# Patient Record
Sex: Female | Born: 1960 | Race: White | Hispanic: No | Marital: Married | Smoking: Never smoker
Health system: Southern US, Community
[De-identification: ages and names within clinical notes are randomized; demographics above are authoritative.]

## PROBLEM LIST (undated history)

## (undated) DIAGNOSIS — E119 Type 2 diabetes mellitus without complications: Secondary | ICD-10-CM

## (undated) DIAGNOSIS — M792 Neuralgia and neuritis, unspecified: Secondary | ICD-10-CM

## (undated) DIAGNOSIS — K760 Fatty (change of) liver, not elsewhere classified: Secondary | ICD-10-CM

## (undated) DIAGNOSIS — Z8719 Personal history of other diseases of the digestive system: Secondary | ICD-10-CM

## (undated) DIAGNOSIS — Z87442 Personal history of urinary calculi: Secondary | ICD-10-CM

## (undated) DIAGNOSIS — G43909 Migraine, unspecified, not intractable, without status migrainosus: Secondary | ICD-10-CM

## (undated) DIAGNOSIS — E039 Hypothyroidism, unspecified: Secondary | ICD-10-CM

## (undated) DIAGNOSIS — E7800 Pure hypercholesterolemia, unspecified: Secondary | ICD-10-CM

## (undated) DIAGNOSIS — B029 Zoster without complications: Secondary | ICD-10-CM

## (undated) DIAGNOSIS — Z9889 Other specified postprocedural states: Secondary | ICD-10-CM

## (undated) DIAGNOSIS — M5020 Other cervical disc displacement, unspecified cervical region: Secondary | ICD-10-CM

## (undated) DIAGNOSIS — G4730 Sleep apnea, unspecified: Secondary | ICD-10-CM

## (undated) DIAGNOSIS — R112 Nausea with vomiting, unspecified: Secondary | ICD-10-CM

## (undated) DIAGNOSIS — M25512 Pain in left shoulder: Secondary | ICD-10-CM

## (undated) HISTORY — DX: Pure hypercholesterolemia, unspecified: E78.00

## (undated) HISTORY — DX: Migraine, unspecified, not intractable, without status migrainosus: G43.909

## (undated) HISTORY — PX: CERVICAL FUSION: SHX112

## (undated) HISTORY — DX: Hypothyroidism, unspecified: E03.9

## (undated) HISTORY — PX: REDUCTION MAMMAPLASTY: SUR839

## (undated) HISTORY — DX: Sleep apnea, unspecified: G47.30

## (undated) HISTORY — DX: Other cervical disc displacement, unspecified cervical region: M50.20

## (undated) HISTORY — DX: Type 2 diabetes mellitus without complications: E11.9

## (undated) HISTORY — DX: Neuralgia and neuritis, unspecified: M79.2

## (undated) HISTORY — PX: CARPAL TUNNEL RELEASE: SHX101

## (undated) HISTORY — PX: COLONOSCOPY: SHX174

---

## 1995-01-04 HISTORY — PX: BREAST REDUCTION SURGERY: SHX8

## 1998-01-03 HISTORY — PX: VAGINAL HYSTERECTOMY: SUR661

## 2005-07-21 DIAGNOSIS — E7800 Pure hypercholesterolemia, unspecified: Secondary | ICD-10-CM | POA: Insufficient documentation

## 2006-06-20 DIAGNOSIS — K2270 Barrett's esophagus without dysplasia: Secondary | ICD-10-CM | POA: Insufficient documentation

## 2007-03-01 DIAGNOSIS — K769 Liver disease, unspecified: Secondary | ICD-10-CM | POA: Insufficient documentation

## 2009-01-28 ENCOUNTER — Encounter: Admission: RE | Admit: 2009-01-28 | Discharge: 2009-04-28 | Payer: Self-pay | Admitting: Obstetrics & Gynecology

## 2009-04-09 DIAGNOSIS — E1165 Type 2 diabetes mellitus with hyperglycemia: Secondary | ICD-10-CM | POA: Insufficient documentation

## 2009-11-04 ENCOUNTER — Ambulatory Visit (HOSPITAL_COMMUNITY): Admission: RE | Admit: 2009-11-04 | Discharge: 2009-11-04 | Payer: Self-pay | Admitting: Obstetrics & Gynecology

## 2009-12-04 ENCOUNTER — Ambulatory Visit (HOSPITAL_COMMUNITY): Admission: RE | Admit: 2009-12-04 | Payer: Self-pay | Admitting: Obstetrics & Gynecology

## 2010-01-13 ENCOUNTER — Ambulatory Visit (HOSPITAL_COMMUNITY): Admission: RE | Admit: 2010-01-13 | Payer: Self-pay | Source: Home / Self Care | Admitting: Obstetrics & Gynecology

## 2010-02-03 ENCOUNTER — Encounter: Payer: Self-pay | Admitting: Obstetrics & Gynecology

## 2010-05-28 ENCOUNTER — Ambulatory Visit: Payer: Self-pay | Admitting: Gynecology

## 2010-08-20 ENCOUNTER — Ambulatory Visit: Payer: BC Managed Care – PPO | Attending: Gynecology | Admitting: Gynecology

## 2010-08-20 DIAGNOSIS — N83209 Unspecified ovarian cyst, unspecified side: Secondary | ICD-10-CM | POA: Insufficient documentation

## 2010-08-20 DIAGNOSIS — Z800 Family history of malignant neoplasm of digestive organs: Secondary | ICD-10-CM | POA: Insufficient documentation

## 2010-08-20 DIAGNOSIS — G4733 Obstructive sleep apnea (adult) (pediatric): Secondary | ICD-10-CM | POA: Insufficient documentation

## 2010-08-20 DIAGNOSIS — Z90710 Acquired absence of both cervix and uterus: Secondary | ICD-10-CM | POA: Insufficient documentation

## 2010-08-23 NOTE — Consult Note (Signed)
Jade Anderson, DERHAMMER NO.:  [id number]  MEDICAL RECORD NO.:  [id number]  LOCATION:  GYN                          FACILITY:  [facility]  PHYSICIAN:  De Blanch, M.D.DATE OF BIRTH:  [birth year]  DATE OF CONSULTATION:  08/20/2010 DATE OF DISCHARGE:                                CONSULTATION   REFERRING PHYSICIAN:  [name], MD  CHIEF COMPLAINT:  Ovarian cysts.  HISTORY OF PRESENT ILLNESS:  A 50 year old white married female seen in consultation at the request of Dr. Hyacinth Meeker regarding management of persistent ovarian cysts.  Apparently, the patient was undergoing evaluation, possible kidney stones in 2010 when a CT scan revealed that she had bilateral ovarian cysts.  These have been followed since 2010 with serial ultrasounds showing fluctuation in the size of the cysts. In the left adnexa, the cyst currently measures 3.3 x 3.1, with some other smaller cysts.  These were echo-free and have smooth margins.  The right adnexa measures 5.9 x 3.3 with multiple small cysts.  These were all simple and echo-free as well.  The patient herself has had no symptoms.  She denies any pelvic pain, pressure, or any urinary tract symptoms.  CA-125 in November 2011 was 6.8.  The patient has a past history of undergoing abdominal hysterectomy in 2007 for heavy bleeding and uterine fibroids.  At that time, apparently extensive adhesions were encountered.  OBSTETRICAL HISTORY:  Gravida 1.  PAST MEDICAL HISTORY:  The patient previously was morbidly obese, but has lost 50 pounds in recent months.  She was diabetic, but is now off all her diabetes medications.  PAST SURGICAL HISTORY:  Bilateral breast reduction 1997; bilateral carpal tunnel release; cervical spine fusion C5, C6 and C7; total abdominal hysterectomy 2007.  CURRENT MEDICATIONS:  Multivitamins and some dietary supplements.  She does not take any prescription medications.  It is noted the patient  has obstructive sleep apnea and uses C-PAP machine.  DRUG ALLERGIES: 1. MACROBID (rash). 2. REGLAN (anxiety). 3. HYDROCODONE (nausea).  The patient does note that she is able to     take oxycodone.  FAMILY HISTORY:  The patient's father had colon cancer.  There is no other gynecologic or breast cancer in the family history.  SOCIAL HISTORY:  The patient has been married for 31 years, does not smoke.  REVIEW OF SYSTEMS:  A 10-point comprehensive review of systems negative except as noted above.  PHYSICAL EXAMINATION:  VITAL SIGNS:  Weight 214 pounds, height 5 feet 3 inches, blood pressure 138/72. GENERAL:  The patient is a healthy, white female, in no acute distress. HEENT:  Negative. NECK:  Supple without thyromegaly.  There is no supraclavicular or inguinal adenopathy. ABDOMEN:  Soft, nontender.  No masses, organomegaly, or ascites are noted.  She has a healed Pfannenstiel incision. PELVIC:  EGBUS, vagina, bladder, urethra are normal.  Cervix and uterus surgically absent.  Adnexa without masses.  I am unable to palpate these cysts, possibly because of the patient's habitus. LOWER EXTREMITIES:  Without edema or varicosities.  IMPRESSION:  Complex, smooth-walled simple cyst of both adnexa which have been present since 2010 and have fluctuated somewhat in size according Dr. Rondel Baton notes.  The patient is entirely asymptomatic.  It is my impression that these are benign and given the fact that she is not symptomatic, would recommend they be followed rather than undergo surgical intervention.  The patient is in agreement with this plan.  She will return to the care of Dr. Hyacinth Meeker and I would suggest she have annual ultrasounds for continuing followup.     De Blanch, M.D.     DC/MEDQ  D:  08/20/2010  T:  08/21/2010  Job:  [id number]  cc:   Telford Nab, R.N. [address] [place], [place] [address]  [name], MD Fax: [phone]  Electronically  Signed by De Blanch M.D. on 08/23/2010 11:44:50 AM

## 2010-11-10 ENCOUNTER — Other Ambulatory Visit: Payer: Self-pay | Admitting: Obstetrics & Gynecology

## 2010-11-10 DIAGNOSIS — Z1231 Encounter for screening mammogram for malignant neoplasm of breast: Secondary | ICD-10-CM

## 2010-12-10 ENCOUNTER — Ambulatory Visit (HOSPITAL_COMMUNITY)
Admission: RE | Admit: 2010-12-10 | Discharge: 2010-12-10 | Disposition: A | Discharge status: Home / Self Care | Payer: BC Managed Care – PPO | Source: Ambulatory Visit | Attending: Obstetrics & Gynecology | Admitting: Obstetrics & Gynecology

## 2010-12-10 DIAGNOSIS — Z1231 Encounter for screening mammogram for malignant neoplasm of breast: Secondary | ICD-10-CM | POA: Insufficient documentation

## 2011-06-07 DIAGNOSIS — M5412 Radiculopathy, cervical region: Secondary | ICD-10-CM | POA: Insufficient documentation

## 2011-11-07 ENCOUNTER — Other Ambulatory Visit: Payer: Self-pay | Admitting: Obstetrics & Gynecology

## 2011-11-07 DIAGNOSIS — Z1231 Encounter for screening mammogram for malignant neoplasm of breast: Secondary | ICD-10-CM

## 2011-12-23 ENCOUNTER — Ambulatory Visit (HOSPITAL_COMMUNITY)
Admission: RE | Admit: 2011-12-23 | Discharge: 2011-12-23 | Disposition: A | Discharge status: Home / Self Care | Payer: BC Managed Care – PPO | Source: Ambulatory Visit | Attending: Obstetrics & Gynecology | Admitting: Obstetrics & Gynecology

## 2011-12-23 DIAGNOSIS — Z1231 Encounter for screening mammogram for malignant neoplasm of breast: Secondary | ICD-10-CM | POA: Insufficient documentation

## 2012-05-10 ENCOUNTER — Encounter: Payer: Self-pay | Admitting: Nurse Practitioner

## 2012-05-10 ENCOUNTER — Ambulatory Visit (INDEPENDENT_AMBULATORY_CARE_PROVIDER_SITE_OTHER): Payer: BC Managed Care – PPO | Admitting: Nurse Practitioner

## 2012-05-10 VITALS — BP 123/74 | HR 72 | Ht 64.5 in | Wt 227.0 lb

## 2012-05-10 DIAGNOSIS — H539 Unspecified visual disturbance: Secondary | ICD-10-CM

## 2012-05-10 DIAGNOSIS — H9319 Tinnitus, unspecified ear: Secondary | ICD-10-CM

## 2012-05-10 NOTE — Progress Notes (Signed)
HPI: Patient returns for followup after initial evaluation Dr. Terrace Arabia 03/06/2012. She continues to have mild  episodes of right tinnitus, seeing flashlight in the peripheral visual field. MRI of the brain done after her last visit was normal. She has a history of migraine headaches but these sensations are different.She is not have daily headache. 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 She's also had 2 cervical spine fusions and sees Dr. Manson Passey at [facility] most recently 4 weeks ago. He wants to do another fusion but she is holding off.  ROS:  - blurred vision, cramps, aching muscles, dizziness, anxiety  Physical Exam General: well developed, well nourished, seated, in no evident distress Head: head normocephalic and atraumatic. Oropharynx benign Neck: with restriction with ROM, no carotid  bruit Cardiovascular: regular rate and rhythm, no murmurs  Neurologic Exam Mental Status: Awake and fully alert. Oriented to place and time. Follows all commands  Mood and affect appropriate.  Cranial Nerves:  Pupils equal, briskly reactive to light. Extraocular movements full without nystagmus. Visual fields full to confrontation. Hearing intact and symmetric to finger snap. Facial sensation intact. Face, tongue, palate move normally and symmetrically. Neck flexion and extension normal.  Motor: Normal bulk and tone. Normal strength in all tested extremity muscles. No focal weakness Sensory.: intact to touch and pinprick and vibratory.  Coordination: Rapid alternating movements normal in all extremities. Finger-to-nose and heel-to-shin performed accurately bilaterally. Gait and Station: Arises from chair without difficulty. Stance is normal. Gait demonstrates normal stride length and balance . Able to heel, toe and tandem walk without difficulty.  Reflexes: 2+ and symmetric. Toes downgoing.     ASSESSMENT: Four-month history of intermittent visual  disturbance also right ear tinnitus. Normal MRI of the brain 03/08/2012. Could be migraine variants however patient does not want to  be placed on medication. Otherwise I'm not sure of the etiology of her visual complaints.     PLAN: Given copy of common migraine triggers Patient does not wish to be on medication at this time Given copy of normal MRI of the brain Followup in 6 months and prn   Nilda Riggs, GNP-BC APRN

## 2012-05-10 NOTE — Patient Instructions (Addendum)
Given copy of common migraine triggers Patient does not wish to be on medication at this time Given copy of normal MRI of the brain Followup in 6 months

## 2012-10-23 ENCOUNTER — Telehealth: Payer: Self-pay | Admitting: Obstetrics & Gynecology

## 2012-10-23 NOTE — Telephone Encounter (Signed)
Dr. Hyacinth Meeker,   This patient is requesting to have a PUS completed the same day as her aex. Is this okay? According to her chart she had a PUS on the same day as her aex last year. There is a sticky in her chart that says pus not available with aex. I just want to make sure I schedule according to your preference. Her chart is in my office if you need it.

## 2012-10-23 NOTE — Telephone Encounter (Signed)
Patient called and scheduled an AEX for 11/26/12 with Dr. Hyacinth Meeker. Patient states she usually has an ultrasound at the same time. Please help patient coordinate all this as she requests to have both done on the same day?

## 2012-10-24 NOTE — Telephone Encounter (Signed)
Yes.  Thank you.

## 2012-10-24 NOTE — Telephone Encounter (Signed)
I have her scheduled for a PUS on 12/4 @ 1pm followed by the OV with you at 1:30pm. Is this okay?

## 2012-10-24 NOTE — Telephone Encounter (Signed)
It is fine.  It is hard doing both same day and coordinating but if you can work it out, that is fine.  Thanks.

## 2012-11-05 ENCOUNTER — Other Ambulatory Visit: Payer: Self-pay | Admitting: Obstetrics & Gynecology

## 2012-11-05 DIAGNOSIS — N83209 Unspecified ovarian cyst, unspecified side: Secondary | ICD-10-CM

## 2012-11-12 ENCOUNTER — Ambulatory Visit (INDEPENDENT_AMBULATORY_CARE_PROVIDER_SITE_OTHER): Payer: BC Managed Care – PPO | Admitting: Nurse Practitioner

## 2012-11-12 ENCOUNTER — Encounter (INDEPENDENT_AMBULATORY_CARE_PROVIDER_SITE_OTHER): Payer: Self-pay

## 2012-11-12 ENCOUNTER — Encounter: Payer: Self-pay | Admitting: Nurse Practitioner

## 2012-11-12 VITALS — BP 123/75 | HR 72 | Ht 65.5 in | Wt 225.0 lb

## 2012-11-12 DIAGNOSIS — H9319 Tinnitus, unspecified ear: Secondary | ICD-10-CM

## 2012-11-12 DIAGNOSIS — H539 Unspecified visual disturbance: Secondary | ICD-10-CM

## 2012-11-12 NOTE — Progress Notes (Signed)
[facility]  PATIENT: Jade Anderson DOB: 05/09/1960   REASON FOR VISIT: follow up for tinnitus    HISTORY OF PRESENT ILLNESS:Jade Anderson , 52 year old returns for followup.  She was last seen 05/10/12. She is only having rare episodes of tinnitus and seeing flashing lights in the peripheral vision. These episodes are rare and she does not wish to be on medication. She is currently being treated for thrush. She has no new complaints.   HISTORY: She continues to have mild episodes of right tinnitus, seeing flashlight in the peripheral visual field. MRI of the brain done after her last visit was normal. She has a history of migraine headaches but these sensations are different.She is not have daily headache. 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 She's also had 2 cervical spine fusions and sees Dr. Manson Passey at [facility] most recently 4 weeks ago. He wants to do another fusion but she is holding off.    REVIEW OF SYSTEMS: Full 14 system review of systems performed and notable only for:  Constitutional: Weight gain Cardiovascular: N/A  Ear/Nose/Throat: N/A  Skin: Itching Eyes: N/A  Respiratory: N/A  Gastroitestinal: N/A  Hematology/Lymphatic: N/A  Endocrine: N/A Musculoskeletal:N/A  Allergy/Immunology: Allergies  Neurological: Occasional dizziness Psychiatric: N/A   ALLERGIES: Allergies  Allergen Reactions  . Eggs Or Egg-Derived Products     HOME MEDICATIONS: Outpatient Prescriptions Prior to Visit  Medication Sig Dispense Refill  . Cholecalciferol (VITAMIN D-3 PO) Take by mouth daily.      Chilton Si Tea, Camillia sinensis, (GREEN TEA PO) Take by mouth.      Marland Kitchen MAGNESIUM CITRATE PO Take by mouth daily.      Marland Kitchen PANCREATIN PO Take by mouth daily.       No facility-administered medications prior to visit.    PAST MEDICAL HISTORY: Past Medical History  Diagnosis Date  . Diabetes   . High cholesterol     . Sleep apnea     PAST SURGICAL HISTORY: Past Surgical History  Procedure Laterality Date  . Carpal tunnel release    . Vaginal hysterectomy    . Cervical fusion      C5-6,2012 and C6-7 2005  . Breast reduction surgery Bilateral     FAMILY HISTORY: Family History  Problem Relation Age of Onset  . Heart Problems Mother   . Gout Father   . High blood pressure Mother   . Diabetes Mother   . Diabetes Father   . Colon cancer Father     SOCIAL HISTORY: History   Social History  . Marital Status: Married    Spouse Name: Fredrik Cove    Number of Children: 1  . Years of Education: 12   Occupational History  . Not on file.   Social History Main Topics  . Smoking status: Never Smoker   . Smokeless tobacco: Never Used  . Alcohol Use: No  . Drug Use: No  . Sexual Activity: Not on file   Other Topics Concern  . Not on file   Social History Narrative   Patient lives at home with her husband Fredrik Cove. Patient has one child and a high school education.    Patient is right-handed.  Patient drinks one cup of tea daily and occasionally a diet soda.     PHYSICAL EXAM  Filed Vitals:   11/12/12 0811  BP: 123/75  Pulse: 72  Height: 5' 5.5" (1.664 m)  Weight:  225 lb (102.059 kg)   Body mass index is 36.86 kg/(m^2).  Generalized: Well developed, obese female in no acute distress, bilateral cerumen  Neurological examination   Mentation: Alert oriented to time, place, history taking. Follows all commands speech and language fluent  Cranial nerve II-XII: Pupils were equal round reactive to light extraocular movements were full, visual field were full on confrontational test. Facial sensation and strength were normal. hearing was intact to finger rubbing bilaterally. Uvula tongue midline. head turning and shoulder shrug and were normal and symmetric.Tongue protrusion into cheek strength was normal. Motor: normal bulk and tone, full strength in the BUE, BLE, fine finger movements  normal, no pronator drift. No focal weakness Coordination: finger-nose-finger, heel-to-shin bilaterally, no dysmetria Reflexes: Brachioradialis 2/2, biceps 2/2, triceps 2/2, patellar 2/2, Achilles 2/2, plantar responses were flexor bilaterally. Gait and Station: Rising up from seated position without assistance, normal stance, moderate stride, good arm swing, smooth turning, able to perform tiptoe, and heel walking without difficulty. Tandem gait is steady  DIAGNOSTIC DATA (LABS, IMAGING, TESTING) -None to review ASSESSMENT AND PLAN  52 y.o. year old female  has a past medical history of Diabetes; High cholesterol; and Sleep apnea and tinnitus  here to followup.  Her episodes of flashing lights and tinnitus are rare she does not want a preventive medication.  Call if they become more frequent F/U yearly and prn Nilda Riggs, [facility], [facility], APRN  [facility] [address], [address] [place], [place] [address] [phone]

## 2012-11-12 NOTE — Patient Instructions (Signed)
Episodes of flashing lights are rare Call if they become more frequent F/U yearly and prn

## 2012-11-26 ENCOUNTER — Ambulatory Visit: Payer: BC Managed Care – PPO | Admitting: Obstetrics & Gynecology

## 2012-12-06 ENCOUNTER — Ambulatory Visit (INDEPENDENT_AMBULATORY_CARE_PROVIDER_SITE_OTHER): Payer: BC Managed Care – PPO

## 2012-12-06 ENCOUNTER — Encounter: Payer: Self-pay | Admitting: Obstetrics & Gynecology

## 2012-12-06 ENCOUNTER — Telehealth: Payer: Self-pay | Admitting: Emergency Medicine

## 2012-12-06 ENCOUNTER — Ambulatory Visit (INDEPENDENT_AMBULATORY_CARE_PROVIDER_SITE_OTHER): Payer: BC Managed Care – PPO | Admitting: Obstetrics & Gynecology

## 2012-12-06 ENCOUNTER — Other Ambulatory Visit: Payer: Self-pay | Admitting: Obstetrics & Gynecology

## 2012-12-06 VITALS — BP 108/62 | HR 72 | Resp 16 | Ht 63.25 in | Wt 226.0 lb

## 2012-12-06 DIAGNOSIS — N838 Other noninflammatory disorders of ovary, fallopian tube and broad ligament: Secondary | ICD-10-CM

## 2012-12-06 DIAGNOSIS — N839 Noninflammatory disorder of ovary, fallopian tube and broad ligament, unspecified: Secondary | ICD-10-CM

## 2012-12-06 DIAGNOSIS — N83209 Unspecified ovarian cyst, unspecified side: Secondary | ICD-10-CM

## 2012-12-06 DIAGNOSIS — Z01419 Encounter for gynecological examination (general) (routine) without abnormal findings: Secondary | ICD-10-CM

## 2012-12-06 DIAGNOSIS — Z1231 Encounter for screening mammogram for malignant neoplasm of breast: Secondary | ICD-10-CM

## 2012-12-06 LAB — BASIC METABOLIC PANEL WITH GFR
Chloride: 102 mEq/L (ref 96–112)
GFR, Est African American: >89 mL/min
GFR, Est Non African American: 86 mL/min
Glucose, Bld: 116 mg/dL — ABNORMAL HIGH (ref 70–99)
Potassium: 4.1 mEq/L (ref 3.5–5.3)
Sodium: 139 mEq/L (ref 135–145)

## 2012-12-06 NOTE — Progress Notes (Signed)
Patient is scheduled for Pelvic MRI w and wo contrast for 12/13/12 at 2000 at [facility] [address].   Patient is scheduled for Screening MMG at [facility] for 12/29 at 12:00 at [facility] .

## 2012-12-06 NOTE — Progress Notes (Signed)
52 y.o. G1P1 MarriedCaucasianF here for annual exam.  Reports having major issues this year with left face and neck pain.  Passed a parotid stone and this helped some.  Did allergy testing.  This was essentially negative.  Had a submandibular gland stone that finally passed.  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  Pain is much better now.    Has also had recurrent conjunctivitis this year.  ENT thinks may be Sjogrens syndrome.  Being tested for thyroid, RA, and other autoimmune tests.  This was just done.  Also having some food sensitivity testing.    HbA1C was 5.2.  Off all medications.  Has kept down her weight.  Cholesterol was mildly elevated.  Has blood work every six months.  Not on cholesterol medication.    PUS also performed today.  Uterus absent.  Left ovary 3.6 x 2.8 x 4.0cm containing and irregular appearing, thick walled but avascular cyst.  Right ovary 4.1 x 3.4 x 2.8cm with multiple cysts.  29 x 24mm thick walled cyst seen.  Second 17mm cyst note.  Both avascular.  Both ovaries are both mildly enlarged from prior scan.  This may be due to new machine she was imaged on today but could also be enlarging size.  Patient's last menstrual period was 01/03/1998.          Sexually active: yes  The current method of family planning is status post hysterectomy.    Exercising: no  not regularly Smoker:  no  Health Maintenance: Pap:  08/20/07 History of abnormal Pap:  yes MMG:  12/23/11 normal Colonoscopy:  6/12 repeat in 5 years, Dr. Loreta Ave BMD:   12/10/10 normal, 0.7/-0.3 TDaP:  11/23/11 Screening Labs: PCP, Hb today: PCP, Urine today: today   reports that she has never smoked. She has never used smokeless tobacco. She reports that she does not drink alcohol or use illicit drugs.  Past Medical History  Diagnosis Date  . Diabetes     no medication  . High cholesterol     no medication  . Sleep apnea   .  Migraine     silent    Past Surgical History  Procedure Laterality Date  . Carpal tunnel release    . Vaginal hysterectomy    . Cervical fusion      C5-6,2012 and C6-7 2005  . Breast reduction surgery Bilateral     Current Outpatient Prescriptions  Medication Sig Dispense Refill  . Cholecalciferol (VITAMIN D-3 PO) Take by mouth daily.      Chilton Si Tea, Camillia sinensis, (GREEN TEA PO) Take by mouth.      Marland Kitchen MAGNESIUM CITRATE PO Take by mouth daily.      . NON FORMULARY HCL with pepsin daily      . PANCREATIN PO Take by mouth daily.      Marland Kitchen tobramycin-dexamethasone (TOBRADEX) ophthalmic solution        No current facility-administered medications for this visit.    Family History  Problem Relation Age of Onset  . Heart Problems Mother   . Gout Father   . High blood pressure Mother   . Diabetes Mother   . Diabetes Father   . Colon cancer Father     ROS:  Pertinent items are noted in HPI.  Otherwise, a comprehensive ROS was negative.  Exam:   BP 108/62  Pulse 72  Resp 16  Ht 5' 3.25" (  1.607 m)  Wt 226 lb (102.513 kg)  BMI 39.70 kg/m2  LMP 01/03/1998  Weight change: +14lbs  Height: 5' 3.25" (160.7 cm)  Ht Readings from Last 3 Encounters:  12/06/12 5' 3.25" (1.607 m)  11/12/12 5' 5.5" (1.664 m)  05/10/12 5' 4.5" (1.638 m)    General appearance: alert, cooperative and appears stated age Head: Normocephalic, without obvious abnormality, atraumatic Neck: no adenopathy, supple, symmetrical, trachea midline and thyroid normal to inspection and palpation Lungs: clear to auscultation bilaterally Breasts: normal appearance, no masses or tenderness Heart: regular rate and rhythm Abdomen: soft, non-tender; bowel sounds normal; no masses,  no organomegaly Extremities: extremities normal, atraumatic, no cyanosis or edema Skin: Skin color, texture, turgor normal. No rashes or lesions Lymph nodes: Cervical, supraclavicular, and axillary nodes normal. No abnormal inguinal  nodes palpated Neurologic: Grossly normal   Pelvic: External genitalia:  no lesions              Urethra:  normal appearing urethra with no masses, tenderness or lesions              Bartholins and Skenes: normal                 Vagina: normal appearing vagina with normal color and discharge, no lesions              Cervix: absent              Pap taken: no Bimanual Exam:  Uterus:  uterus absent              Adnexa: difficult to palpate ovaries but no abnormalities palpated               Rectovaginal: Confirms               Anus:  normal sphincter tone, no lesions  A:  Well Woman with normal exam PMP, no HRT Type 2 Diabetes H/O 2 neck surgeries H/O complex ovarian cysts Digestive issues which is improved with gastric acid medications (which are OTC products)   P:   Mammogram yearly pap smear not indicated Will plan ca-125 and Pelvic MRI as ovarian cysts are increased in size this year.   Labs every six months with PCP return annually or prn   In addition to AEX, about 15 minutes spent in face to face discussion about ultrasound findings and MRI.

## 2012-12-06 NOTE — Telephone Encounter (Signed)
Patient is scheduled for MRI of pelvis with and without contrast at [facility] on 12/11 at 8:00 PM. Can you precert.  This is for Ovarian Mass.

## 2012-12-12 ENCOUNTER — Encounter: Payer: Self-pay | Admitting: Obstetrics & Gynecology

## 2012-12-13 ENCOUNTER — Ambulatory Visit
Admission: RE | Admit: 2012-12-13 | Discharge: 2012-12-13 | Disposition: A | Discharge status: Home / Self Care | Payer: BC Managed Care – PPO | Source: Ambulatory Visit | Attending: Obstetrics & Gynecology | Admitting: Obstetrics & Gynecology

## 2012-12-13 DIAGNOSIS — N838 Other noninflammatory disorders of ovary, fallopian tube and broad ligament: Secondary | ICD-10-CM

## 2012-12-13 MED ORDER — GADOBENATE DIMEGLUMINE 529 MG/ML IV SOLN
20.0000 mL | Freq: Once | INTRAVENOUS | Status: AC | PRN
  Administered 2012-12-13: 20 mL via INTRAVENOUS

## 2012-12-14 ENCOUNTER — Telehealth: Payer: Self-pay | Admitting: Emergency Medicine

## 2012-12-14 DIAGNOSIS — N83209 Unspecified ovarian cyst, unspecified side: Secondary | ICD-10-CM

## 2012-12-14 NOTE — Telephone Encounter (Signed)
Message copied by Joeseph Amor on Fri Dec 14, 2012  9:03 AM ------      Message from: Jerene Bears      Created: Thu Dec 13, 2012  5:20 PM       Please call pt.  MRI showed simple cysts on the left ovary.  A hemorrhagic cyst noted on the right.  These are benign.  Follow up ultrasound 1 year recommended. ------

## 2012-12-14 NOTE — Telephone Encounter (Signed)
Message left to return call to Miangel Flom at [phone].    

## 2012-12-19 NOTE — Telephone Encounter (Signed)
Spoke with patient and message given. U/S and AEX scheduled together per patient request. U/S for next year scheduled.

## 2012-12-31 ENCOUNTER — Other Ambulatory Visit: Payer: Self-pay | Admitting: Obstetrics & Gynecology

## 2012-12-31 ENCOUNTER — Ambulatory Visit (HOSPITAL_COMMUNITY)
Admission: RE | Admit: 2012-12-31 | Discharge: 2012-12-31 | Disposition: A | Discharge status: Home / Self Care | Payer: BC Managed Care – PPO | Source: Ambulatory Visit | Attending: Obstetrics & Gynecology | Admitting: Obstetrics & Gynecology

## 2012-12-31 DIAGNOSIS — Z1231 Encounter for screening mammogram for malignant neoplasm of breast: Secondary | ICD-10-CM

## 2013-04-23 ENCOUNTER — Other Ambulatory Visit: Payer: Self-pay | Admitting: Gastroenterology

## 2013-04-23 DIAGNOSIS — R1011 Right upper quadrant pain: Secondary | ICD-10-CM

## 2013-05-03 ENCOUNTER — Ambulatory Visit (HOSPITAL_COMMUNITY): Payer: BC Managed Care – PPO

## 2013-05-08 ENCOUNTER — Ambulatory Visit (HOSPITAL_COMMUNITY)
Admission: RE | Admit: 2013-05-08 | Discharge: 2013-05-08 | Disposition: A | Discharge status: Home / Self Care | Payer: BC Managed Care – PPO | Source: Ambulatory Visit | Attending: Gastroenterology | Admitting: Gastroenterology

## 2013-05-08 DIAGNOSIS — N200 Calculus of kidney: Secondary | ICD-10-CM | POA: Insufficient documentation

## 2013-05-08 DIAGNOSIS — R1011 Right upper quadrant pain: Secondary | ICD-10-CM | POA: Insufficient documentation

## 2013-05-08 DIAGNOSIS — R110 Nausea: Secondary | ICD-10-CM | POA: Insufficient documentation

## 2013-05-08 MED ORDER — SINCALIDE 5 MCG IJ SOLR
INTRAMUSCULAR | Status: AC
  Administered 2013-05-08: 2.09 ug via INTRAVENOUS
  Filled 2013-05-08: qty 5

## 2013-05-08 MED ORDER — SINCALIDE 5 MCG IJ SOLR
0.0200 ug/kg | Freq: Once | INTRAMUSCULAR | Status: AC
  Administered 2013-05-08: 2.09 ug via INTRAVENOUS

## 2013-05-08 MED ORDER — SINCALIDE 5 MCG IJ SOLR
INTRAMUSCULAR | Status: AC
  Filled 2013-05-08: qty 5

## 2013-05-08 MED ORDER — TECHNETIUM TC 99M MEBROFENIN IV KIT
5.0000 | PACK | Freq: Once | INTRAVENOUS | Status: AC | PRN
  Administered 2013-05-08: 5 via INTRAVENOUS

## 2013-05-08 MED ORDER — STERILE WATER FOR INJECTION IJ SOLN
INTRAMUSCULAR | Status: AC
  Filled 2013-05-08: qty 10

## 2013-05-30 ENCOUNTER — Encounter (HOSPITAL_COMMUNITY): Payer: Self-pay | Admitting: Emergency Medicine

## 2013-05-30 ENCOUNTER — Emergency Department (HOSPITAL_COMMUNITY)
Admission: EM | Admit: 2013-05-30 | Discharge: 2013-05-30 | Disposition: A | Discharge status: Home / Self Care | Payer: BC Managed Care – PPO | Source: Home / Self Care | Attending: Emergency Medicine | Admitting: Emergency Medicine

## 2013-05-30 DIAGNOSIS — G5630 Lesion of radial nerve, unspecified upper limb: Secondary | ICD-10-CM

## 2013-05-30 DIAGNOSIS — M79609 Pain in unspecified limb: Secondary | ICD-10-CM

## 2013-05-30 DIAGNOSIS — M79603 Pain in arm, unspecified: Secondary | ICD-10-CM

## 2013-05-30 DIAGNOSIS — M5412 Radiculopathy, cervical region: Secondary | ICD-10-CM

## 2013-05-30 MED ORDER — GABAPENTIN 300 MG PO CAPS: 300.0000 mg | ORAL_CAPSULE | Freq: Three times a day (TID) | ORAL | Status: DC | PRN

## 2013-05-30 NOTE — Discharge Instructions (Signed)

## 2013-05-30 NOTE — ED Provider Notes (Signed)
CSN: [id number]     Arrival date & time 05/30/13  1224 History   First MD Initiated Contact with Patient 05/30/13 1234     Chief Complaint  Patient presents with  . Extremity Pain    arm    (Consider location/radiation/quality/duration/timing/severity/associated sxs/prior Treatment) HPI Comments: 53 year old female presents for evaluation of left arm pain. She had a HIDA scan done 3 weeks ago and she had a IV placed in her left antecubital fossa. After that, she developed swelling and a bruise that was painful. This was causing pain to radiate both up and down her arm. The bruise went away and the pain that radiated up her arm got better. However, she is still having pain in her forearm. She describes this as a severe burning pain that has been getting progressively worse. It is located in the dorsum of her entire forearm. The area is closer to her elbow is extremely tender but the rest of the arm feels strange. He also occasionally feels numb on the back of her hand. No swelling in the hand. No weakness in the hand  Patient is a 53 y.o. female presenting with extremity pain.  Extremity Pain    Past Medical History  Diagnosis Date  . Diabetes     no medication  . High cholesterol     no medication  . Sleep apnea   . Migraine     silent   Past Surgical History  Procedure Laterality Date  . Carpal tunnel release    . Vaginal hysterectomy    . Cervical fusion      C5-6,2012 and C6-7 2005  . Breast reduction surgery Bilateral    Family History  Problem Relation Age of Onset  . Heart Problems Mother   . Gout Father   . High blood pressure Mother   . Diabetes Mother   . Diabetes Father   . Colon cancer Father    History  Substance Use Topics  . Smoking status: Never Smoker   . Smokeless tobacco: Never Used  . Alcohol Use: No   OB History   Grav Para Term Preterm Abortions TAB SAB Ect Mult Living   1 1        1      Review of Systems  Musculoskeletal:       Arm pain,  see HPI  Neurological: Positive for weakness and numbness.  All other systems reviewed and are negative.   Allergies  Eggs or egg-derived products; Hydrocodone; and Macrobid  Home Medications   Prior to Admission medications   Medication Sig Start Date End Date Taking? Authorizing Provider  Cholecalciferol (VITAMIN D-3 PO) Take by mouth daily.    Historical Provider, MD  gabapentin (NEURONTIN) 300 MG capsule Take 1 capsule (300 mg total) by mouth 3 (three) times daily as needed. 05/30/13   Freeman Caldron Ashton Sabine, PA-C  Green Tea, Camillia sinensis, (GREEN TEA PO) Take by mouth.    Historical Provider, MD  MAGNESIUM CITRATE PO Take by mouth daily.    Historical Provider, MD  NON FORMULARY HCL with pepsin daily    Historical Provider, MD  PANCREATIN PO Take by mouth daily.    Historical Provider, MD  tobramycin-dexamethasone [facility]) ophthalmic solution  12/03/12   Historical Provider, MD   BP 118/71  Pulse 80  Temp(Src) 98.7 F (37.1 C) (Oral)  Resp 12  SpO2 100%  LMP 01/03/1998 Physical Exam  Nursing note and vitals reviewed. Constitutional: She is oriented to person, place, and time.  Vital signs are normal. She appears well-developed and well-nourished. No distress.  HENT:  Head: Normocephalic and atraumatic.  Cardiovascular:  Pulses:      Radial pulses are 2+ on the right side, and 2+ on the left side.  Pulmonary/Chest: Effort normal. No respiratory distress.  Musculoskeletal:       Left elbow: Tenderness found. Radial head (as well as TTP over entire dorsal forearm ) tenderness noted.  Neurological: She is alert and oriented to person, place, and time. She has normal strength and normal reflexes. No sensory deficit. She exhibits normal muscle tone. Coordination normal.  Her grip she is equal bilaterally. Her finger abduction and adduction, and thumb opposition is 5 out of 5 bilaterally  Skin: Skin is warm and dry. No rash noted. She is not diaphoretic.  Psychiatric: She has a  normal mood and affect. Judgment normal.    ED Course  Procedures (including critical care time) Labs Review Labs Reviewed - No data to display  Imaging Review No results found.   MDM   1. Arm pain   2. Radial neuritis    She must have what is hopefully temporary irritation of the radial nerve. I highly doubt permanent direct damage considering that she did not have any pain immediately. Will prescribe gabapentin to take as needed. This should resolve over the next couple weeks. She is instructed to followup with neurology or with her primary care provider if this does not resolve.  Meds ordered this encounter  Medications  . gabapentin (NEURONTIN) 300 MG capsule    Sig: Take 1 capsule (300 mg total) by mouth 3 (three) times daily as needed.    Dispense:  30 capsule    Refill:  0    Order Specific Question:  Supervising Provider    Answer:  Ihor Gully D Morenci, PA-C 05/31/13 2217

## 2013-05-30 NOTE — ED Notes (Signed)
Pt  States  She  Had  An iv  In  Her l  Arm   About  3  Weeks  Ago    That  Was  Placed  For a  Contrast procedure       She  Reports  Pain in the  Affected  Arm      descibes  It  As  Pain   Like needles  And  Pins       No  Swelling  Cap  Refill  Seems intact         No  Obvious bruising         Seen  By  Dr Collene Mares  Today in the  Office

## 2013-05-31 ENCOUNTER — Telehealth: Payer: Self-pay | Admitting: Neurology

## 2013-05-31 NOTE — Telephone Encounter (Signed)
Patient needs letter faxed regarding being dismissed from jury duty because patient has silent migraines with visual problems--letter does not need to state patients problems--Fax #[phone]--ATTN: H.F. Haymore--please call patient and advise--thank you.

## 2013-05-31 NOTE — Telephone Encounter (Signed)
Patient is scheduled for Jury duty on June 19, 2013 for orientation and would like a letter stating she is not able to do this because of her medical problems, silent migraines and visual problems, which you do not have to mention in the letter.

## 2013-06-01 NOTE — ED Provider Notes (Signed)
Medical screening examination/treatment/procedure(s) were performed by non-physician practitioner and as supervising physician I was immediately available for consultation/collaboration.  Philipp Deputy, M.D.  Harden Mo, MD 06/01/13 [phone]

## 2013-06-03 NOTE — Telephone Encounter (Signed)
Jade Anderson, please generate a letter for her jury duty excuse.

## 2013-06-05 NOTE — Telephone Encounter (Signed)
Patient calling to check on the status of her jury duty excuse letter, states that her phone has been out since yesterday and she is just making sure she didn't miss a call. Please return call and advise.

## 2013-06-06 ENCOUNTER — Encounter: Payer: Self-pay | Admitting: Neurology

## 2013-06-06 NOTE — Telephone Encounter (Signed)
Letter has been faxed to clerk of court and confirmation received.   Patient has been notified that letter was faxed.

## 2013-07-03 DIAGNOSIS — M5020 Other cervical disc displacement, unspecified cervical region: Secondary | ICD-10-CM

## 2013-07-03 HISTORY — DX: Other cervical disc displacement, unspecified cervical region: M50.20

## 2013-07-29 DIAGNOSIS — M7710 Lateral epicondylitis, unspecified elbow: Secondary | ICD-10-CM | POA: Insufficient documentation

## 2013-09-26 DIAGNOSIS — M7580 Other shoulder lesions, unspecified shoulder: Secondary | ICD-10-CM

## 2013-09-26 DIAGNOSIS — M778 Other enthesopathies, not elsewhere classified: Secondary | ICD-10-CM | POA: Insufficient documentation

## 2013-11-04 ENCOUNTER — Encounter (HOSPITAL_COMMUNITY): Payer: Self-pay | Admitting: Emergency Medicine

## 2013-11-12 ENCOUNTER — Ambulatory Visit (INDEPENDENT_AMBULATORY_CARE_PROVIDER_SITE_OTHER): Payer: BC Managed Care – PPO | Admitting: Nurse Practitioner

## 2013-11-12 ENCOUNTER — Encounter: Payer: Self-pay | Admitting: Nurse Practitioner

## 2013-11-12 VITALS — BP 116/81 | HR 78 | Ht 64.0 in | Wt 240.8 lb

## 2013-11-12 DIAGNOSIS — H539 Unspecified visual disturbance: Secondary | ICD-10-CM

## 2013-11-12 DIAGNOSIS — E785 Hyperlipidemia, unspecified: Secondary | ICD-10-CM | POA: Insufficient documentation

## 2013-11-12 DIAGNOSIS — H9313 Tinnitus, bilateral: Secondary | ICD-10-CM

## 2013-11-12 DIAGNOSIS — K219 Gastro-esophageal reflux disease without esophagitis: Secondary | ICD-10-CM | POA: Insufficient documentation

## 2013-11-12 NOTE — Patient Instructions (Signed)
Episodes of flashing light continue to be intermittent to rare Keep a record if they become more frequent Follow-up yearly and when necessary

## 2013-11-12 NOTE — Progress Notes (Signed)
[facility]  PATIENT: Jade Anderson DOB: [birth year]   REASON FOR VISIT: follow up for tinnitis   HISTORY OF PRESENT ILLNESS: Ms Jade Anderson , 53 year old returns for followup. She was last seen 11/12/12.  She is only having rare episodes of tinnitus and seeing flashing lights in the peripheral vision. These episodes are rare and she does not wish to be on medication. She also has a history of migraine but these are under control. 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 She returns for reevaluation.She has no Jade complaints.   HISTORY: She continues to have mild episodes of right tinnitus, seeing flashlight in the peripheral visual field. MRI of the brain done after her last visit was normal. She has a history of migraine headaches but these sensations are different.She is not have daily headache. 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 She's also had 2 cervical spine fusions and sees Dr. Owens Anderson at [facility] most recently 4 weeks ago. He wants to do another fusion but she is holding off.  REVIEW OF SYSTEMS: Full 14 system review of systems performed and notable only for those listed, all others are neg:  Constitutional: N/A  Cardiovascular: N/A  Ear/Nose/Throat: N/A  Skin: N/A  Eyes: N/A  Respiratory: N/A  Gastroitestinal: N/A  Hematology/Lymphatic: easy bruising  Endocrine: N/A Musculoskeletal:joint pain, muscle cramps, neck pain, walking difficulty Allergy/Immunology: environmental allergies Neurological: N/A Psychiatric: N/A Sleep : obstructive sleep apnea with CPAP ALLERGIES: Allergies  Allergen Reactions  . Eggs Or Egg-Derived Products   . Hydrocodone   . Macrobid [Nitrofurantoin Monohyd Macro]     HOME MEDICATIONS: Outpatient Prescriptions Prior to Visit  Medication Sig Dispense Refill  . Cholecalciferol (VITAMIN D-3 PO)  Take by mouth daily.    Nyoka Cowden Tea, Camillia sinensis, (GREEN TEA PO) Take by mouth.    Marland Kitchen MAGNESIUM CITRATE PO Take by mouth daily.    . NON FORMULARY HCL with pepsin daily    . PANCREATIN PO Take by mouth daily.    Marland Kitchen gabapentin (NEURONTIN) 300 MG capsule Take 1 capsule (300 mg total) by mouth 3 (three) times daily as needed. 30 capsule 0  . tobramycin-dexamethasone (TOBRADEX) ophthalmic solution      No facility-administered medications prior to visit.    PAST MEDICAL HISTORY: Past Medical History  Diagnosis Date  . Diabetes     no medication  . High cholesterol     no medication  . Sleep apnea   . Migraine     silent  . Ruptured disc, cervical C7-T1  . Nerve pain left elbow following IV  . Nerve pain right thigh    PAST SURGICAL HISTORY: Past Surgical History  Procedure Laterality Date  . Carpal tunnel release    . Vaginal hysterectomy    . Cervical fusion      C5-6,2012 and C6-7 2005  . Breast reduction Anderson Bilateral     FAMILY HISTORY: Family History  Problem Relation Age of Onset  . Heart Problems Mother   . Gout Father   . High blood pressure Mother   . Diabetes Mother   . Diabetes Father   . Colon cancer Father     SOCIAL HISTORY: History   Social History  . Marital Status: Married    Spouse Name: Jade Anderson    Number of Children: 1  . Years of Education:  12   Occupational History  . Not on file.   Social History Main Topics  . Smoking status: Never Smoker   . Smokeless tobacco: Never Used  . Alcohol Use: No  . Drug Use: No  . Sexual Activity:    Partners: Male    Birth Control/ Protection: Surgical     Comment: TAH   Other Topics Concern  . Not on file   Social History Narrative   Patient lives at home with her husband Jade Anderson. Patient has one child and a high school education.    Patient is right-handed.  Patient drinks one cup of tea daily and occasionally a diet soda.     PHYSICAL EXAM  Filed Vitals:   11/12/13 1253  BP:  116/81  Pulse: 78  Height: 5\' 4"  (1.626 m)  Weight: 240 lb 12.8 oz (109.226 kg)   Body mass index is 41.31 kg/(m^2). Generalized: Well developed, obese female in no acute distress  Neurological examination   Mentation: Alert oriented to time, place, history taking. Follows all commands speech and language fluent  Cranial nerve II-XII: Visual acuity 20/50 bilateral. Pupils were equal round reactive to light extraocular movements were full, visual field were full on confrontational test. Facial sensation and strength were normal. hearing was intact to finger rubbing bilaterally. Uvula tongue midline. head turning and shoulder shrug and were normal and symmetric.Tongue protrusion into cheek strength was normal. Motor: normal bulk and tone, full strength in the BUE, BLE, fine finger movements normal, no pronator drift. No focal weakness Coordination: finger-nose-finger, heel-to-shin bilaterally, no dysmetria Reflexes: Brachioradialis 2/2, biceps 2/2, triceps 2/2, patellar 2/2, Achilles 2/2, plantar responses were flexor bilaterally. Gait and Station: Rising up from seated position without assistance, normal stance, moderate stride, good arm swing, smooth turning, able to perform tiptoe, and heel walking without difficulty. Tandem gait is steady  DIAGNOSTIC DATA (LABS, IMAGING, TESTING) - I reviewed patient records, labs, notes, testing and imaging myself where available.      Component Value Date/Time   NA 139 12/06/2012 1444   K 4.1 12/06/2012 1444   CL 102 12/06/2012 1444   CO2 29 12/06/2012 1444   GLUCOSE 116* 12/06/2012 1444   BUN 16 12/06/2012 1444   CREATININE 0.79 12/06/2012 1444   CALCIUM 9.7 12/06/2012 1444   GFRNONAA 86 12/06/2012 1444   GFRAA >89 12/06/2012 1444    ASSESSMENT AND PLAN  53 y.o. year old female  has a past medical history of Diabetes; High cholesterol; Sleep apnea; Migraine; and tinnitus here to follow up. Her episodes of tinnitus are rare. She is not on  any preventive medication  Episodes of flashing light continue to be intermittent to rare Keep a record if they become more frequent Follow-up yearly and when necessary Jade Bible, [facility], [facility], APRN  [facility] [address], [place], [place] [address] [phone]

## 2013-11-18 NOTE — Progress Notes (Signed)
I agree above plan. 

## 2013-12-10 ENCOUNTER — Other Ambulatory Visit: Payer: Self-pay | Admitting: Obstetrics & Gynecology

## 2013-12-10 DIAGNOSIS — Z1231 Encounter for screening mammogram for malignant neoplasm of breast: Secondary | ICD-10-CM

## 2013-12-12 ENCOUNTER — Ambulatory Visit: Payer: BC Managed Care – PPO | Admitting: Obstetrics & Gynecology

## 2013-12-19 ENCOUNTER — Ambulatory Visit (INDEPENDENT_AMBULATORY_CARE_PROVIDER_SITE_OTHER): Payer: BC Managed Care – PPO | Admitting: Obstetrics & Gynecology

## 2013-12-19 ENCOUNTER — Encounter: Payer: Self-pay | Admitting: Obstetrics & Gynecology

## 2013-12-19 ENCOUNTER — Ambulatory Visit (INDEPENDENT_AMBULATORY_CARE_PROVIDER_SITE_OTHER): Payer: BC Managed Care – PPO

## 2013-12-19 VITALS — BP 120/78 | HR 80 | Resp 16 | Ht 63.0 in | Wt 244.0 lb

## 2013-12-19 DIAGNOSIS — N839 Noninflammatory disorder of ovary, fallopian tube and broad ligament, unspecified: Secondary | ICD-10-CM

## 2013-12-19 DIAGNOSIS — N8320 Unspecified ovarian cysts: Secondary | ICD-10-CM

## 2013-12-19 DIAGNOSIS — Z0000 Encounter for general adult medical examination without abnormal findings: Secondary | ICD-10-CM

## 2013-12-19 DIAGNOSIS — Z01419 Encounter for gynecological examination (general) (routine) without abnormal findings: Secondary | ICD-10-CM

## 2013-12-19 DIAGNOSIS — E119 Type 2 diabetes mellitus without complications: Secondary | ICD-10-CM

## 2013-12-19 DIAGNOSIS — N838 Other noninflammatory disorders of ovary, fallopian tube and broad ligament: Secondary | ICD-10-CM

## 2013-12-19 DIAGNOSIS — N83209 Unspecified ovarian cyst, unspecified side: Secondary | ICD-10-CM

## 2013-12-19 LAB — POCT URINALYSIS DIPSTICK
LEUKOCYTES UA: NEGATIVE
PH UA: 5
UROBILINOGEN UA: NEGATIVE

## 2013-12-19 LAB — COMPREHENSIVE METABOLIC PANEL
ALK PHOS: 64 U/L (ref 39–117)
ALT: 26 U/L (ref 0–35)
AST: 19 U/L (ref 0–37)
Albumin: 4 g/dL (ref 3.5–5.2)
BILIRUBIN TOTAL: 0.5 mg/dL (ref 0.2–1.2)
BUN: 12 mg/dL (ref 6–23)
CO2: 26 mEq/L (ref 19–32)
Calcium: 9.1 mg/dL (ref 8.4–10.5)
Chloride: 101 mEq/L (ref 96–112)
Creat: 0.83 mg/dL (ref 0.50–1.10)
Glucose, Bld: 131 mg/dL — ABNORMAL HIGH (ref 70–99)
Potassium: 4.3 mEq/L (ref 3.5–5.3)
Sodium: 136 mEq/L (ref 135–145)
Total Protein: 6.7 g/dL (ref 6.0–8.3)

## 2013-12-19 LAB — TSH: TSH: 2.111 u[IU]/mL (ref 0.350–4.500)

## 2013-12-19 LAB — HEMOGLOBIN, FINGERSTICK: Hemoglobin, fingerstick: 14.5 g/dL (ref 12.0–16.0)

## 2013-12-19 LAB — LIPID PANEL
Cholesterol: 254 mg/dL — ABNORMAL HIGH (ref 0–200)
HDL: 46 mg/dL (ref >39)
LDL Cholesterol: 156 mg/dL — ABNORMAL HIGH (ref 0–99)
Total CHOL/HDL Ratio: 5.5 Ratio
Triglycerides: 259 mg/dL — ABNORMAL HIGH (ref <150)
VLDL: 52 mg/dL — AB (ref 0–40)

## 2013-12-19 NOTE — Progress Notes (Addendum)
53 y.o. G1P1 MarriedCaucasianF here for annual exam.  Pt reports her back continues to be problematic.  Her [facility] physician is now working in [place].  Pt would very much like to see someone more local.  D/W pt options.  She is considering going to [facility].    Pt asks questions today about hormonal therapy.  Denies hot flashes.    Still dealing with husband's job loss due to injury.  They had an award this year but it was appealed.  This has been since April.   PUS also performed today. Uterus absent. Left ovary 2.7 x 1.8 x 2.4cm (was 3.6 x 2.8 x 4.0 cm containing and irregular appearing, thick walled but avascular cyst)  Right ovary 4.4 x 4.0 x 3.2cm (was 4.1 x 3.4 x 2.8cm with multiple cysts). 40mm cyst only noted today.  Avascular but thick-walled with scattered internal debris.  Pt has seen Dr. Fermin Schwab and conservative follow up was recommended as long as no significant change on ultrasound noted.    Patient's last menstrual period was 01/03/1998.          Sexually active: Yes.    The current method of family planning is status post hysterectomy.    Exercising: Yes.    The patient does not participate in regular exercise at present. Smoker:  no  Health Maintenance: Pap:  2009 History of abnormal Pap:  yes MMG:  01/01/13 Bi-rads 1: Negative , appt is scheduled for 12/31/13 Colonoscopy:  2012- Normal f/u in 2017 BMD:   12/11 0.7/-0.3 TDaP:  11/23/11  Screening Labs: yes , Hgb today: 14.5 , Urine today: Negative    reports that she has never smoked. She has never used smokeless tobacco. She reports that she does not drink alcohol or use illicit drugs.  Past Medical History  Diagnosis Date  . Diabetes     no medication  . High cholesterol     no medication  . Sleep apnea   . Migraine     silent  . 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 Nerve pain left elbow following IV  . Nerve pain right thigh    Past Surgical History  Procedure Laterality Date  . Carpal tunnel  release    . Vaginal hysterectomy    . Cervical fusion      C5-6,2012 and C6-7 2005  . Breast reduction surgery Bilateral     Current Outpatient Prescriptions  Medication Sig Dispense Refill  . (No Medication Selected) Apply 1 application topically 4 (four) times daily. Compound cream Ketamin/Gaba/cloni/lido/Ami 120.    . Ascorbic Acid (VITAMIN C) 1000 MG tablet 1,000 mg daily.    Marland Kitchen b complex vitamins tablet Take 1 tablet by mouth daily.    . Calcium Lactate 500 MG CAPS Take 250 mg by mouth daily.    . Cholecalciferol (VITAMIN D-3 PO) Take by mouth daily.    . Coenzyme Q-10 100 MG capsule Take 100 mg by mouth daily.    . Digestive Enzymes (BETAINE HCL) 650-130 MG CAPS Take by mouth 3 (three) times daily with meals. With pepsin 162mg .    . finasteride (PROSCAR) 5 MG tablet Take 5 mg by mouth daily. Take one half tab daily.    Nyoka Cowden Tea, Camillia sinensis, (GREEN TEA PO) Take by mouth.    Marland Kitchen MAGNESIUM CITRATE PO Take by mouth daily.    . NON FORMULARY HCL with pepsin daily    . PANCREATIN PO Take by mouth daily.    Marland Kitchen  Probiotic Product (ACIDOPHILUS/GOAT MILK) CAPS daily.     No current facility-administered medications for this visit.    Family History  Problem Relation Age of Onset  . Heart Problems Mother   . Gout Father   . High blood pressure Mother   . Diabetes Mother   . Diabetes Father   . Colon cancer Father     ROS:  Pertinent items are noted in HPI.  Otherwise, a comprehensive ROS was negative.  Exam:   BP 120/78 mmHg  Pulse 80  Resp 16  Ht 5\' 3"  (1.6 m)  Wt 244 lb (110.678 kg)  BMI 43.23 kg/m2  LMP 01/03/1998   Height: 5\' 3"  (160 cm)  Ht Readings from Last 3 Encounters:  12/19/13 5\' 3"  (1.6 m)  11/12/13 5\' 4"  (1.626 m)  12/06/12 5' 3.25" (1.607 m)   General appearance: alert, cooperative and appears stated age Head: Normocephalic, without obvious abnormality, atraumatic Neck: no adenopathy, supple, symmetrical, trachea midline and thyroid normal to  inspection and palpation Lungs: clear to auscultation bilaterally Breasts: normal appearance, no masses or tenderness Heart: regular rate and rhythm Abdomen: soft, non-tender; bowel sounds normal; no masses,  no organomegaly Extremities: extremities normal, atraumatic, no cyanosis or edema Skin: Skin color, texture, turgor normal. No rashes or lesions Lymph nodes: Cervical, supraclavicular, and axillary nodes normal. No abnormal inguinal nodes palpated Neurologic: Grossly normal   Pelvic: External genitalia:  no lesions              Urethra:  normal appearing urethra with no masses, tenderness or lesions              Bartholins and Skenes: normal                 Vagina: normal appearing vagina with normal color and discharge, no lesions              Cervix: absent              Pap taken: No. Bimanual Exam:  Uterus:  uterus absent              Adnexa: no mass, fullness, tenderness               Rectovaginal: Confirms               Anus:  normal sphincter tone, no lesions  Chaperone was present for exam.  A:  Well Woman with normal exam PMP, no HRT Type 2 Diabetes H/O multiple neck surgeries H/O complex ovarian cysts.  Doing yearly ultrasounds and ca-125. Digestive issues which is improved with gastric acid medications (which are OTC products)   P: Mammogram yearly pap smear not indicated BMD next year with MMG Ca-125 today.  Repeat PUS one year.   Labs every six months with PCP but pt hasn't had any this year.  Vit D, Lipids, CMP, TSH. return annually or prn  In additional to AEX, ~15 minutes spent discussing ultrasound findings, ca-125 values, and future evaluation.

## 2013-12-20 LAB — HEMOGLOBIN A1C
Hgb A1c MFr Bld: 5.7 % — ABNORMAL HIGH (ref <5.7)
Mean Plasma Glucose: 117 mg/dL — ABNORMAL HIGH (ref <117)

## 2013-12-20 LAB — CA 125: CA 125: 9 U/mL (ref <35)

## 2013-12-20 LAB — VITAMIN D 25 HYDROXY (VIT D DEFICIENCY, FRACTURES): VIT D 25 HYDROXY: 21 ng/mL — AB (ref 30–100)

## 2013-12-24 ENCOUNTER — Telehealth: Payer: Self-pay

## 2013-12-24 DIAGNOSIS — E559 Vitamin D deficiency, unspecified: Secondary | ICD-10-CM

## 2013-12-24 NOTE — Telephone Encounter (Signed)
Lmtcb//kn 

## 2013-12-24 NOTE — Addendum Note (Signed)
Addended by: Megan Salon on: 12/24/2013 11:32 AM   Modules accepted: Miquel Dunn

## 2013-12-24 NOTE — Telephone Encounter (Signed)
-----   Message from Lyman Speller, MD sent at 12/23/2013  4:57 PM EST ----- Inform on labs, glucose was 131 and hba1c was 5.7.  Wasn't fasting.  Lipids were elevated at 254 and triglycerides were elevated at 259 (reflection of blood sugar) and LDLs were elevated at 156.  She will need copies to go to her PCP.    TSH was normal.  Vit D was low at 21.  She needs 50,000 IU for 12 weeks with repeat lab.  Can do here and I can place order.    Finally, ca-125 was normal.

## 2013-12-30 MED ORDER — VITAMIN D (ERGOCALCIFEROL) 1.25 MG (50000 UNIT) PO CAPS: 50000.0000 [IU] | ORAL_CAPSULE | ORAL | Status: DC

## 2013-12-30 NOTE — Telephone Encounter (Signed)
Pt informed of results below. Vitamin D rx has been sent in and Lab appt made for 12 weeks. Dr. Sabra Heck can you order her labs please.

## 2013-12-31 NOTE — Telephone Encounter (Signed)
Order placed.  OK to close encounter.

## 2014-01-01 ENCOUNTER — Ambulatory Visit (HOSPITAL_COMMUNITY)
Admission: RE | Admit: 2014-01-01 | Discharge: 2014-01-01 | Disposition: A | Discharge status: Home / Self Care | Payer: BC Managed Care – PPO | Source: Ambulatory Visit | Attending: Obstetrics & Gynecology | Admitting: Obstetrics & Gynecology

## 2014-01-01 DIAGNOSIS — Z1231 Encounter for screening mammogram for malignant neoplasm of breast: Secondary | ICD-10-CM | POA: Diagnosis not present

## 2014-01-07 ENCOUNTER — Other Ambulatory Visit: Payer: Self-pay | Admitting: Obstetrics & Gynecology

## 2014-01-07 DIAGNOSIS — R928 Other abnormal and inconclusive findings on diagnostic imaging of breast: Secondary | ICD-10-CM

## 2014-01-16 ENCOUNTER — Ambulatory Visit
Admission: RE | Admit: 2014-01-16 | Discharge: 2014-01-16 | Disposition: A | Discharge status: Home / Self Care | Payer: BLUE CROSS/BLUE SHIELD | Source: Ambulatory Visit | Attending: Obstetrics & Gynecology | Admitting: Obstetrics & Gynecology

## 2014-01-16 DIAGNOSIS — R928 Other abnormal and inconclusive findings on diagnostic imaging of breast: Secondary | ICD-10-CM

## 2014-03-24 ENCOUNTER — Other Ambulatory Visit (INDEPENDENT_AMBULATORY_CARE_PROVIDER_SITE_OTHER): Payer: BLUE CROSS/BLUE SHIELD

## 2014-03-24 DIAGNOSIS — E559 Vitamin D deficiency, unspecified: Secondary | ICD-10-CM

## 2014-03-25 LAB — VITAMIN D 25 HYDROXY (VIT D DEFICIENCY, FRACTURES): Vit D, 25-Hydroxy: 36 ng/mL (ref 30–100)

## 2014-04-02 ENCOUNTER — Telehealth: Payer: Self-pay | Admitting: Obstetrics & Gynecology

## 2014-04-02 NOTE — Telephone Encounter (Signed)
Spoke with patient. Advised of results and message as seen below from [place]. Patient is agreeable and verbalizes understanding.  Notes Recorded by Megan Salon, MD on 04/01/2014 at 1:15 PM Inform pt vit D is 36. Better. Goal is greater than 40. She needs to continue the Vit D 50K every other week and I will recheck at her AEX next year. THanks.  Routing to provider for final review. Patient agreeable to disposition. Will close encounter

## 2014-04-02 NOTE — Telephone Encounter (Signed)
Patient is calling for recent vit d results. Patient last seen 03/24/14 and was told her results would be back in 48 hours.

## 2014-06-09 ENCOUNTER — Other Ambulatory Visit: Payer: Self-pay | Admitting: Obstetrics & Gynecology

## 2014-06-09 DIAGNOSIS — N63 Unspecified lump in unspecified breast: Secondary | ICD-10-CM

## 2014-07-18 ENCOUNTER — Ambulatory Visit
Admission: RE | Admit: 2014-07-18 | Discharge: 2014-07-18 | Disposition: A | Discharge status: Home / Self Care | Payer: BLUE CROSS/BLUE SHIELD | Source: Ambulatory Visit | Attending: Obstetrics & Gynecology | Admitting: Obstetrics & Gynecology

## 2014-07-18 DIAGNOSIS — N63 Unspecified lump in unspecified breast: Secondary | ICD-10-CM

## 2014-11-04 HISTORY — PX: HEMORRHOID SURGERY: SHX153

## 2014-11-12 ENCOUNTER — Telehealth: Payer: Self-pay | Admitting: Obstetrics & Gynecology

## 2014-11-12 ENCOUNTER — Other Ambulatory Visit: Payer: Self-pay

## 2014-11-12 DIAGNOSIS — Z1231 Encounter for screening mammogram for malignant neoplasm of breast: Secondary | ICD-10-CM

## 2014-11-12 DIAGNOSIS — Z9889 Other specified postprocedural states: Secondary | ICD-10-CM

## 2014-11-12 DIAGNOSIS — Z8742 Personal history of other diseases of the female genital tract: Secondary | ICD-10-CM

## 2014-11-12 NOTE — Telephone Encounter (Signed)
Spoke with patient. Patient states that yearly she has her aex and a ultrasound performed. Last PUS and aex performed on 01/19/2013. Per note patient to have PUS yearly. Appointment for PUS and aex scheduled for 12/22 at 12:30 pm with 1 pm consult and aex with Dr.Miller. Patient is agreeable to date and time.  Routing to provider for final review. Patient agreeable to disposition. Will close encounter.

## 2014-11-12 NOTE — Telephone Encounter (Signed)
Patient received letter in the mail to schedule AEX. Patient typically also has a ultrasound along with her annual exams because Dr. Sabra Heck likes to check her ovaries.   Please advise Best # to reach: [phone]

## 2014-11-13 ENCOUNTER — Ambulatory Visit (INDEPENDENT_AMBULATORY_CARE_PROVIDER_SITE_OTHER): Payer: BLUE CROSS/BLUE SHIELD | Admitting: Nurse Practitioner

## 2014-11-13 ENCOUNTER — Encounter: Payer: Self-pay | Admitting: Nurse Practitioner

## 2014-11-13 VITALS — BP 138/80 | HR 90 | Ht 63.0 in | Wt 247.0 lb

## 2014-11-13 DIAGNOSIS — H539 Unspecified visual disturbance: Secondary | ICD-10-CM | POA: Diagnosis not present

## 2014-11-13 DIAGNOSIS — H9313 Tinnitus, bilateral: Secondary | ICD-10-CM | POA: Diagnosis not present

## 2014-11-13 NOTE — Patient Instructions (Signed)
Episodes of flashing light continue to be intermittent to rare Keep a record if they become more frequent Ear wax in left ear greater than right try Debrox OTC follow instructions Follow-up yearly and when necessary

## 2014-11-13 NOTE — Progress Notes (Signed)
I have reviewed and agreed above plan. 

## 2014-11-13 NOTE — Progress Notes (Signed)
[facility]  PATIENT: Jade Anderson DOB: [birth year]   REASON FOR VISIT: Follow-up for bilateral tinnitus, visual disturbance, headache HISTORY FROM: Patient    HISTORY OF PRESENT ILLNESS:Ms Jade Anderson , 54 year old returns for followup. She was last seen 11/12/13. She is only having rare episodes of tinnitus and seeing flashing lights in the peripheral vision. These episodes are rare and she does not wish to be on medication. The episodes occur once a month or less. She also has a history of migraine but these are under control. She continues to be evaluated at [facility] for her cervical spine and at [place] [facility] for Barretts esophagus. She returns for reevaluation.She has no new complaints.   HISTORY: She continues to have mild episodes of right tinnitus, seeing flashlight in the peripheral visual field. MRI of the brain done after her last visit was normal. She has a history of migraine headaches but these sensations are different.She is not have daily headache. 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 She's also had 2 cervical spine fusions and sees Dr. Owens Shark at [facility] most recently 4 weeks ago. He wants to do another fusion but she is holding off.    REVIEW OF SYSTEMS: Full 14 system review of systems performed and notable only for those listed, all others are neg:  Constitutional: neg  Cardiovascular: neg Ear/Nose/Throat: neg  Skin: neg Eyes: neg Respiratory: neg Gastroitestinal: neg  Hematology/Lymphatic: neg  Endocrine: neg Musculoskeletal:neg Allergy/Immunology: Food allergies, environmental allergies Neurological: neg Psychiatric: Anxiety Sleep : Obstructive sleep apnea with CPAP   ALLERGIES: Allergies  Allergen Reactions  . Eggs Or Egg-Derived Products   . Hydrocodone   . Macrobid [employer] Macro]   . Metoclopramide     Other reaction(s): Anxiety  . Other Nausea And  Vomiting    ENTEX    HOME MEDICATIONS: Outpatient Prescriptions Prior to Visit  Medication Sig Dispense Refill  . Ascorbic Acid (VITAMIN C) 1000 MG tablet 1,000 mg daily.    Marland Kitchen b complex vitamins tablet Take 1 tablet by mouth daily.    . Calcium Lactate 500 MG CAPS Take 250 mg by mouth daily.    . Cholecalciferol (VITAMIN D-3 PO) Take 2,000 Units by mouth daily.     . Coenzyme Q-10 100 MG capsule Take 100 mg by mouth daily.    . Digestive Enzymes (BETAINE HCL) 650-130 MG CAPS Take by mouth 3 (three) times daily with meals. With pepsin 162mg .    . Green Tea, Camillia sinensis, (GREEN TEA PO) Take by mouth.    Marland Kitchen MAGNESIUM CITRATE PO Take by mouth daily.    . NON FORMULARY HCL with pepsin daily    . PANCREATIN PO Take by mouth daily.    . Probiotic Product (ACIDOPHILUS/GOAT MILK) CAPS daily.    . (No Medication Selected) Apply 1 application topically 4 (four) times daily. Compound cream Ketamin/Gaba/cloni/lido/Ami 120.    . finasteride (PROSCAR) 5 MG tablet Take 5 mg by mouth daily. Take one half tab daily.    . Vitamin D, Ergocalciferol, (DRISDOL) 50000 UNITS CAPS capsule Take 1 capsule (50,000 Units total) by mouth every 7 (seven) days. (Patient not taking: Reported on 11/13/2014) 12 capsule 0   No facility-administered medications prior to visit.    PAST MEDICAL HISTORY: Past Medical History  Diagnosis Date  . Diabetes ([place])     no medication  . High cholesterol     no medication  .  Sleep apnea   . Migraine     silent  . 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 Nerve pain left elbow following IV  . Nerve pain right thigh    PAST SURGICAL HISTORY: Past Surgical History  Procedure Laterality Date  . Carpal tunnel release    . Vaginal hysterectomy    . Cervical fusion      C5-6,2012 and C6-7 2005  . Breast reduction surgery Bilateral   . Hemorrhoid surgery  11/2014    external with clots      FAMILY HISTORY: Family History  Problem Relation Age of Onset  . Heart  Problems Mother   . Gout Father   . High blood pressure Mother   . Diabetes Mother   . Diabetes Father   . Colon cancer Father     SOCIAL HISTORY: Social History   Social History  . Marital Status: Married    Spouse Name: Jade Anderson  . Number of Children: 1  . Years of Education: 12   Occupational History  . Not on file.   Social History Main Topics  . Smoking status: Never Smoker   . Smokeless tobacco: Never Used  . Alcohol Use: No  . Drug Use: No  . Sexual Activity:    Partners: Male    Birth Control/ Protection: Surgical     Comment: TAH   Other Topics Concern  . Not on file   Social History Narrative   Patient lives at home with her husband Jade Anderson. Patient has one child and a high school education.    Patient is right-handed.  Patient drinks one cup of tea daily and occasionally a diet soda.     PHYSICAL EXAM  Filed Vitals:   11/13/14 1305  BP: 138/80  Pulse: 90  Height: 5\' 3"  (1.6 m)  Weight: 247 lb (112.038 kg)   Body mass index is 43.76 kg/(m^2). Generalized: Well developed, obese female in no acute distress, bilateral cerumen  Neurological examination   Mentation: Alert oriented to time, place, history taking. Follows all commands speech and language fluent  Cranial nerve II-XII: Visual acuity 20/20 right, 20/30 left. Pupils were equal round reactive to light extraocular movements were full, visual field were full on confrontational test. Facial sensation and strength were normal. hearing was intact to finger rubbing bilaterally. Uvula tongue midline. head turning and shoulder shrug and were normal and symmetric.Tongue protrusion into cheek strength was normal. Motor: normal bulk and tone, full strength in the BUE, BLE, fine finger movements normal, no pronator drift. No focal weakness Coordination: finger-nose-finger, heel-to-shin bilaterally, no dysmetria Reflexes: Brachioradialis 2/2, biceps 2/2, triceps 2/2, patellar 2/2, Achilles 2/2, plantar responses  were flexor bilaterally. Gait and Station: Rising up from seated position without assistance, normal stance, moderate stride, good arm swing, smooth turning, able to perform tiptoe, and heel walking without difficulty. Tandem gait is steady   DIAGNOSTIC DATA (LABS, IMAGING, TESTING) -  ASSESSMENT AND PLAN  54 y.o. year old female   has a past medical history of Diabetes; High cholesterol; Sleep apnea; Migraine; and tinnitus here to follow up. Her episodes of tinnitus are rare. She is not on any preventive medication;  here to follow-up.  Episodes of flashing light continue to be intermittent to rare Keep a record if they become more frequent Follow-up yearly and when necessary [name], [facility], [facility], APRN  [facility] [address] [place], [place] [address] [phone]

## 2014-12-25 ENCOUNTER — Encounter: Payer: Self-pay | Admitting: Obstetrics & Gynecology

## 2014-12-25 ENCOUNTER — Ambulatory Visit (INDEPENDENT_AMBULATORY_CARE_PROVIDER_SITE_OTHER): Payer: BLUE CROSS/BLUE SHIELD | Admitting: Obstetrics & Gynecology

## 2014-12-25 ENCOUNTER — Ambulatory Visit (INDEPENDENT_AMBULATORY_CARE_PROVIDER_SITE_OTHER): Payer: BLUE CROSS/BLUE SHIELD

## 2014-12-25 VITALS — BP 110/70 | HR 88 | Resp 18 | Ht 63.0 in | Wt 238.0 lb

## 2014-12-25 DIAGNOSIS — Z8742 Personal history of other diseases of the female genital tract: Secondary | ICD-10-CM

## 2014-12-25 DIAGNOSIS — E119 Type 2 diabetes mellitus without complications: Secondary | ICD-10-CM | POA: Diagnosis not present

## 2014-12-25 DIAGNOSIS — Z01419 Encounter for gynecological examination (general) (routine) without abnormal findings: Secondary | ICD-10-CM

## 2014-12-25 DIAGNOSIS — Z0000 Encounter for general adult medical examination without abnormal findings: Secondary | ICD-10-CM | POA: Diagnosis not present

## 2014-12-25 DIAGNOSIS — Z124 Encounter for screening for malignant neoplasm of cervix: Secondary | ICD-10-CM

## 2014-12-25 DIAGNOSIS — E669 Obesity, unspecified: Secondary | ICD-10-CM | POA: Diagnosis not present

## 2014-12-25 DIAGNOSIS — Z205 Contact with and (suspected) exposure to viral hepatitis: Secondary | ICD-10-CM | POA: Diagnosis not present

## 2014-12-25 DIAGNOSIS — N839 Noninflammatory disorder of ovary, fallopian tube and broad ligament, unspecified: Secondary | ICD-10-CM | POA: Diagnosis not present

## 2014-12-25 DIAGNOSIS — N838 Other noninflammatory disorders of ovary, fallopian tube and broad ligament: Secondary | ICD-10-CM

## 2014-12-25 DIAGNOSIS — E1169 Type 2 diabetes mellitus with other specified complication: Secondary | ICD-10-CM

## 2014-12-25 LAB — CBC
HCT: 45.2 % (ref 36.0–46.0)
HEMOGLOBIN: 15.4 g/dL — AB (ref 12.0–15.0)
MCH: 30.9 pg (ref 26.0–34.0)
MCHC: 34.1 g/dL (ref 30.0–36.0)
MCV: 90.8 fL (ref 78.0–100.0)
MPV: 9.8 fL (ref 8.6–12.4)
PLATELETS: 325 10*3/uL (ref 150–400)
RBC: 4.98 MIL/uL (ref 3.87–5.11)
RDW: 13.9 % (ref 11.5–15.5)
WBC: 11.8 10*3/uL — AB (ref 4.0–10.5)

## 2014-12-25 LAB — COMPREHENSIVE METABOLIC PANEL
ALK PHOS: 69 U/L (ref 33–130)
ALT: 45 U/L — AB (ref 6–29)
AST: 20 U/L (ref 10–35)
Albumin: 4.4 g/dL (ref 3.6–5.1)
BUN: 18 mg/dL (ref 7–25)
CALCIUM: 9.8 mg/dL (ref 8.6–10.4)
CHLORIDE: 100 mmol/L (ref 98–110)
CO2: 27 mmol/L (ref 20–31)
Creat: 0.92 mg/dL (ref 0.50–1.05)
Glucose, Bld: 124 mg/dL — ABNORMAL HIGH (ref 65–99)
Potassium: 4.8 mmol/L (ref 3.5–5.3)
Sodium: 139 mmol/L (ref 135–146)
Total Bilirubin: 0.4 mg/dL (ref 0.2–1.2)
Total Protein: 7.3 g/dL (ref 6.1–8.1)

## 2014-12-25 LAB — LIPID PANEL
Cholesterol: 238 mg/dL — ABNORMAL HIGH (ref 125–200)
HDL: 48 mg/dL (ref >=46)
LDL Cholesterol: 160 mg/dL — ABNORMAL HIGH (ref <130)
Total CHOL/HDL Ratio: 5 Ratio (ref <=5.0)
Triglycerides: 149 mg/dL (ref <150)
VLDL: 30 mg/dL (ref <30)

## 2014-12-25 LAB — TSH: TSH: 0.93 u[IU]/mL (ref 0.350–4.500)

## 2014-12-25 LAB — HEMOGLOBIN A1C
Hgb A1c MFr Bld: 6 % — ABNORMAL HIGH (ref <5.7)
MEAN PLASMA GLUCOSE: 126 mg/dL — AB (ref <117)

## 2014-12-25 NOTE — Progress Notes (Signed)
54 y.o. G1P1 MarriedCaucasianF here for annual exam.  Doing well except for recent sinus infection.  Reports her son and family is coming from [place] the day after Christmas.  They have a child that is 63 months old.    Had thrombosed hemorrhoid opened in office with general surgeon in [place], [place] earlier this year.  No trouble since.  PCP:  Dr. Vista Deck.  Pt thinking about changing PCPs.   reveiwed ultrasound findings with pt.  Cervix and uterus surgically absent.  Left ovary 3.6 x 2.6 x 2.2cm with slight increase from 2015. Avascular with septations.  Right ovary 4.8 x 2.9 x 2.8cm with 26 x 33mm thickwalled, smooth, avascular cyst.  Stable since last year.  Second 13 x 53mm cyst, avascular with mixed echoes, smooth walled as well.  Comparison also made to 2014.  Left ovary is still smaller than 2014.  Do not feel surgery is necessary at this time.  Will check ca-125 today.  Repeat PUS with AEX next year.    Patient's last menstrual period was 01/03/1998.          Sexually active: Yes.    The current method of family planning is status post hysterectomy.    Exercising: Yes.    reports "some" Smoker:  no  Health Maintenance: Pap:  2009  History of abnormal Pap:  yes MMG:  1/16, follow up 7/16 due to breast cyst.  Pt has follow up scheduled in [place] Colonoscopy:  2012, follow up 2017.  Dr. Collene Mares did last one BMD:   12/12 normal TDaP:  11/13  Screening Labs: will do today, Hb today: today, Urine today: pending   reports that she has never smoked. She has never used smokeless tobacco. She reports that she does not drink alcohol or use illicit drugs.  Past Medical History  Diagnosis Date  . Diabetes ([place])     no medication  . High cholesterol     no medication  . Sleep apnea   . Migraine     silent  . 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 Nerve pain left elbow following IV  . Nerve pain right thigh    Past Surgical History  Procedure Laterality Date  . Carpal tunnel  release    . Vaginal hysterectomy    . Cervical fusion      C5-6,2012 and C6-7 2005  . Breast reduction surgery Bilateral   . Hemorrhoid surgery  11/2014    external with clots      Current Outpatient Prescriptions  Medication Sig Dispense Refill  . Ascorbic Acid (VITAMIN C) 1000 MG tablet 1,000 mg daily.    Marland Kitchen b complex vitamins tablet Take 1 tablet by mouth daily.    . Calcium Lactate 500 MG CAPS Take 250 mg by mouth daily.    . Cholecalciferol (VITAMIN D-3 PO) Take 2,000 Units by mouth daily.     . Coenzyme Q-10 100 MG capsule Take 100 mg by mouth daily.    Marland Kitchen MAGNESIUM CITRATE PO Take by mouth daily.    . NON FORMULARY HCL with pepsin daily    . PANCREATIN PO Take by mouth daily.    . penicillin v potassium (VEETID) 500 MG tablet TK 1 T PO TID  0  . predniSONE (DELTASONE) 20 MG tablet TK 2 TS PO D  0  . Probiotic Product (ACIDOPHILUS/GOAT MILK) CAPS daily.    . benzonatate (TESSALON) 200 MG capsule Reported on 12/25/2014  0  No current facility-administered medications for this visit.    Family History  Problem Relation Age of Onset  . Heart Problems Mother   . Gout Father   . High blood pressure Mother   . Diabetes Mother   . Diabetes Father   . Colon cancer Father     ROS:  Pertinent items are noted in HPI.  Otherwise, a comprehensive ROS was negative.  Exam:   BP 110/70 mmHg  Pulse 88  Resp 18  Ht 5\' 3"  (1.6 m)  Wt 238 lb (107.956 kg)  BMI 42.17 kg/m2  LMP 01/03/1998  Weight change: -6#   Height: 5\' 3"  (160 cm)  Ht Readings from Last 3 Encounters:  12/25/14 5\' 3"  (1.6 m)  11/13/14 5\' 3"  (1.6 m)  12/19/13 5\' 3"  (1.6 m)    General appearance: alert, cooperative and appears stated age Head: Normocephalic, without obvious abnormality, atraumatic Neck: no adenopathy, supple, symmetrical, trachea midline and thyroid normal to inspection and palpation Lungs: clear to auscultation bilaterally Breasts: normal appearance, no masses or tenderness Heart: regular  rate and rhythm Abdomen: soft, non-tender; bowel sounds normal; no masses,  no organomegaly Extremities: extremities normal, atraumatic, no cyanosis or edema Skin: Skin color, texture, turgor normal. No rashes or lesions Lymph nodes: Cervical, supraclavicular, and axillary nodes normal. No abnormal inguinal nodes palpated Neurologic: Grossly normal   Pelvic: External genitalia:  no lesions              Urethra:  normal appearing urethra with no masses, tenderness or lesions              Bartholins and Skenes: normal                 Vagina: normal appearing vagina with normal color and discharge, no lesions              Cervix: absent              Pap taken: No. Bimanual Exam:  Uterus:  uterus absent              Adnexa: no mass, fullness, tenderness               Rectovaginal: Confirms               Anus:  normal sphincter tone, no lesions  Chaperone was present for exam.  A:  Well Woman with normal exam PMP, no HRT Type 2 Diabetes H/O multiple neck surgeries H/O complex ovarian cysts. Doing yearly ultrasounds and ca-125. Digestive issues seen by Dr. Collene Mares.  Colonoscopy UTD.  P: Mammogram yearly.  Has follow scheduled in January. pap smear obtained today. Ca-125 today. Repeat PUS one year.   CMP, TSH, Vit D, Lipids, CBC, HbA1C today.  Results will be called to pt. Hep C antibody testing today. return annually or prn  ~15 minutes spent with patient >50% of time was in face to face discussion of ultrasound, images were reviewed and comparied with prior images with pt.  Follow up plans made.

## 2014-12-25 NOTE — Patient Instructions (Signed)
[employer].  [employer].

## 2014-12-26 LAB — CA 125: CA 125: 8 U/mL (ref <35)

## 2014-12-26 LAB — HEPATITIS C ANTIBODY: HCV AB: NEGATIVE

## 2014-12-26 LAB — VITAMIN D 25 HYDROXY (VIT D DEFICIENCY, FRACTURES): Vit D, 25-Hydroxy: 32 ng/mL (ref 30–100)

## 2014-12-26 LAB — IPS PAP TEST WITH REFLEX TO HPV

## 2015-01-04 DIAGNOSIS — E039 Hypothyroidism, unspecified: Secondary | ICD-10-CM

## 2015-01-04 HISTORY — DX: Hypothyroidism, unspecified: E03.9

## 2015-01-20 ENCOUNTER — Ambulatory Visit
Admission: RE | Admit: 2015-01-20 | Discharge: 2015-01-20 | Disposition: A | Discharge status: Home / Self Care | Payer: BLUE CROSS/BLUE SHIELD | Source: Ambulatory Visit

## 2015-01-20 ENCOUNTER — Other Ambulatory Visit: Payer: Self-pay

## 2015-01-20 DIAGNOSIS — Z1231 Encounter for screening mammogram for malignant neoplasm of breast: Secondary | ICD-10-CM

## 2015-01-20 DIAGNOSIS — Z9889 Other specified postprocedural states: Secondary | ICD-10-CM

## 2015-02-09 ENCOUNTER — Other Ambulatory Visit: Payer: BLUE CROSS/BLUE SHIELD

## 2015-02-09 DIAGNOSIS — R748 Abnormal levels of other serum enzymes: Secondary | ICD-10-CM

## 2015-02-09 LAB — COMPLETE METABOLIC PANEL WITH GFR
ALK PHOS: 58 U/L (ref 33–130)
ALT: 37 U/L — AB (ref 6–29)
AST: 23 U/L (ref 10–35)
Albumin: 4 g/dL (ref 3.6–5.1)
BUN: 14 mg/dL (ref 7–25)
CALCIUM: 9.2 mg/dL (ref 8.6–10.4)
CO2: 27 mmol/L (ref 20–31)
Chloride: 104 mmol/L (ref 98–110)
Creat: 0.87 mg/dL (ref 0.50–1.05)
GFR, EST NON AFRICAN AMERICAN: 76 mL/min (ref >=60)
GFR, Est African American: 87 mL/min (ref >=60)
Glucose, Bld: 97 mg/dL (ref 65–99)
POTASSIUM: 4.4 mmol/L (ref 3.5–5.3)
SODIUM: 141 mmol/L (ref 135–146)
Total Bilirubin: 0.5 mg/dL (ref 0.2–1.2)
Total Protein: 6.6 g/dL (ref 6.1–8.1)

## 2015-02-12 ENCOUNTER — Telehealth: Payer: Self-pay | Admitting: Emergency Medicine

## 2015-02-12 DIAGNOSIS — R748 Abnormal levels of other serum enzymes: Secondary | ICD-10-CM

## 2015-02-12 NOTE — Telephone Encounter (Signed)
-----   Message from Megan Salon, MD sent at 02/11/2015  8:08 AM EST ----- Inform pt her ALT is still mildly elevated.  Did have Hep C testing with last visit.  I think she should follow up with the GI.  We can make referral if she desires.

## 2015-04-20 NOTE — Telephone Encounter (Signed)
Late entry: Patient aware of results and referral was placed for patient to See Dr. Collene Mares.  Had appointment 02/19/15 with Dr. Collene Mares. Call to Dr. Lorie Apley office and records will be sent.  She has a colonoscopy scheduled with Dr. Collene Mares on 06/08/15.   Routing to provider for final review. Patient agreeable to disposition. Will close encounter.

## 2015-11-10 ENCOUNTER — Telehealth: Payer: Self-pay | Admitting: Obstetrics & Gynecology

## 2015-11-10 NOTE — Telephone Encounter (Signed)
Patient has a PUS/ Aex scheduled 12/31/15 with Dr.Miller. Patient thinks she may want to reschedule pending available dates.

## 2015-11-10 NOTE — Telephone Encounter (Signed)
Patient calling to change PUS with AEX appt. For 12/31/15 Patient rescheduled to 01/07/15 at 12:00/1:00pm with Dr. Sabra Heck. Patient agreeable to date and time.  Routing to provider for final review. Patient is agreeable to disposition. Will close encounter.

## 2015-11-16 ENCOUNTER — Encounter: Payer: Self-pay | Admitting: Nurse Practitioner

## 2015-11-16 ENCOUNTER — Ambulatory Visit (INDEPENDENT_AMBULATORY_CARE_PROVIDER_SITE_OTHER): Payer: BLUE CROSS/BLUE SHIELD | Admitting: Nurse Practitioner

## 2015-11-16 VITALS — BP 139/81 | HR 71 | Ht 63.0 in | Wt 249.6 lb

## 2015-11-16 DIAGNOSIS — H9313 Tinnitus, bilateral: Secondary | ICD-10-CM

## 2015-11-16 DIAGNOSIS — H539 Unspecified visual disturbance: Secondary | ICD-10-CM

## 2015-11-16 NOTE — Patient Instructions (Signed)
Episodes of flashing light continue to be intermittent to rare Patient does not wish to be on preventive medication Keep a record if they become more frequent Follow-up yearly and when necessary

## 2015-11-16 NOTE — Progress Notes (Signed)
[facility]  PATIENT: Alailah Hyre DOB: April 07, 1960   REASON FOR VISIT: Follow-up for bilateral tinnitus, visual disturbance, headache HISTORY FROM: Patient    HISTORY OF PRESENT ILLNESS:Ms Monsour , 55 year old returns for yearly followup.She is only having rare episodes of tinnitus and seeing flashing lights in the peripheral vision. These episodes are rare and she does not wish to be on medication. The episodes occur once a month or less. She also has a history of migraine but these are under control. She continues to be evaluated at [facility] for her cervical spine and at [facility] for [place]ts esophagus. She returns for reevaluation.She has no new complaints.   HISTORY: She continues to have mild episodes of right tinnitus, seeing flashlight in the peripheral visual field. MRI of the brain done after her last visit was normal. She has a history of migraine headaches but these sensations are different.She is not have daily headache. 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 She's also had 2 cervical spine fusions and sees Dr. Owens Shark at [facility] most recently 4 weeks ago. He wants to do another fusion but she is holding off.    REVIEW OF SYSTEMS: Full 14 system review of systems performed and notable only for those listed, all others are neg:  Constitutional: neg  Cardiovascular: neg Ear/Nose/Throat: neg  Skin: neg Eyes: neg Respiratory: neg Gastroitestinal: Constipation Hematology/Lymphatic: neg  Endocrine: neg Musculoskeletal:neg Allergy/Immunology: Food allergies, environmental allergies Neurological: neg Psychiatric: neg Sleep : Obstructive sleep apnea with CPAP   ALLERGIES: Allergies  Allergen Reactions  . Phenylephrine-Guaifenesin Nausea Only  . Eggs Or Egg-Derived Products   . Hydrocodone   . Metoclopramide     Other reaction(s): Anxiety  . Other Nausea And Vomiting    ENTEX    . Macrobid [Nitrofurantoin Monohyd Macro] Rash    HOME MEDICATIONS: Outpatient Medications Prior to Visit  Medication Sig Dispense Refill  . Ascorbic Acid (VITAMIN C) 1000 MG tablet 1,000 mg daily.    Marland Kitchen b complex vitamins tablet Take 1 tablet by mouth daily.    . benzonatate (TESSALON) 200 MG capsule Reported on 12/25/2014  0  . Calcium Lactate 500 MG CAPS Take 250 mg by mouth daily.    . Cholecalciferol (VITAMIN D-3 PO) Take 2,000 Units by mouth daily.     . Coenzyme Q-10 100 MG capsule Take 100 mg by mouth daily.    Marland Kitchen MAGNESIUM CITRATE PO Take by mouth daily.    . NON FORMULARY HCL with pepsin daily    . PANCREATIN PO Take by mouth daily.    . penicillin v potassium (VEETID) 500 MG tablet TK 1 T PO TID  0  . predniSONE (DELTASONE) 20 MG tablet TK 2 TS PO D  0  . Probiotic Product (ACIDOPHILUS/GOAT MILK) CAPS daily.     No facility-administered medications prior to visit.     PAST MEDICAL HISTORY: Past Medical History:  Diagnosis Date  . Diabetes ([place])    no medication  . High cholesterol    no medication  . Migraine    silent  . Nerve pain left elbow following IV  . Nerve pain right thigh  . 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 Sleep apnea     PAST SURGICAL HISTORY: Past Surgical History:  Procedure Laterality Date  . BREAST REDUCTION SURGERY Bilateral 1997  . CARPAL TUNNEL RELEASE    . CERVICAL FUSION  OB:[id number] and C6-7 2005  . HEMORRHOID SURGERY  11/2014   thrombosed hemorroid opened in office  . VAGINAL HYSTERECTOMY  2000    FAMILY HISTORY: Family History  Problem Relation Age of Onset  . Heart Problems Mother   . Gout Father   . High blood pressure Mother   . Diabetes Mother   . Diabetes Father   . Colon cancer Father     SOCIAL HISTORY: Social History   Social History  . Marital status: Married    Spouse name: Francee Piccolo  . Number of children: 1  . Years of education: 79   Occupational History  . Not on file.   Social History Main  Topics  . Smoking status: Never Smoker  . Smokeless tobacco: Never Used  . Alcohol use No  . Drug use: No  . Sexual activity: Yes    Partners: Male    Birth control/ protection: Surgical     Comment: TAH   Other Topics Concern  . Not on file   Social History Narrative   Patient lives at home with her husband Francee Piccolo. Patient has one child and a high school education.    Patient is right-handed.  Patient drinks one cup of tea daily and occasionally a diet soda.     PHYSICAL EXAM  Vitals:   11/16/15 0947  Weight: 249 lb 9.6 oz (113.2 kg)  Height: 5\' 3"  (1.6 m)   Body mass index is 44.21 kg/m. Generalized: Well developed, obese female in no acute distress, Neurological examination   Mentation: Alert oriented to time, place, history taking. Follows all commands speech and language fluent  Cranial nerve II-XII: Visual acuity 20/30 right, 20/40 left. Pupils were equal round reactive to light extraocular movements were full, visual field were full on confrontational test. Facial sensation and strength were normal. hearing was intact to finger rubbing bilaterally. Uvula tongue midline. head turning and shoulder shrug and were normal and symmetric.Tongue protrusion into cheek strength was normal. Motor: normal bulk and tone, full strength in the BUE, BLE, fine finger movements normal, no pronator drift. No focal weakness Coordination: finger-nose-finger, heel-to-shin bilaterally, no dysmetria Reflexes: Brachioradialis 2/2, biceps 2/2, triceps 2/2, patellar 2/2, Achilles 2/2, plantar responses were flexor bilaterally. Gait and Station: Rising up from seated position without assistance, normal stance, moderate stride, good arm swing, smooth turning, able to perform tiptoe, and heel walking without difficulty. Tandem gait is steady   DIAGNOSTIC DATA (LABS, IMAGING, TESTING) -  ASSESSMENT AND PLAN  55 y.o. year old female   has a past medical history of Diabetes; High cholesterol; Sleep  apnea; Migraine; and tinnitus here to follow up. Her episodes of tinnitus are rare. She is not on any preventive medication;  here to follow-up.  Episodes of flashing light continue to be intermittent to rare Patient does not wish to be on any preventive medication at this time Keep a record if they become more frequent Follow-up yearly and when necessary [name], [facility], [facility], APRN  [facility] [address], [place], [place] [address] [phone]

## 2015-11-17 NOTE — Progress Notes (Signed)
I have reviewed and agreed above plan. 

## 2015-12-24 ENCOUNTER — Other Ambulatory Visit: Payer: Self-pay | Admitting: Obstetrics & Gynecology

## 2015-12-24 DIAGNOSIS — Z1231 Encounter for screening mammogram for malignant neoplasm of breast: Secondary | ICD-10-CM

## 2015-12-31 ENCOUNTER — Other Ambulatory Visit: Payer: BLUE CROSS/BLUE SHIELD

## 2015-12-31 ENCOUNTER — Other Ambulatory Visit: Payer: BLUE CROSS/BLUE SHIELD | Admitting: Obstetrics & Gynecology

## 2016-01-04 DIAGNOSIS — B029 Zoster without complications: Secondary | ICD-10-CM

## 2016-01-04 HISTORY — DX: Zoster without complications: B02.9

## 2016-01-07 ENCOUNTER — Ambulatory Visit (INDEPENDENT_AMBULATORY_CARE_PROVIDER_SITE_OTHER): Payer: BLUE CROSS/BLUE SHIELD | Admitting: Obstetrics & Gynecology

## 2016-01-07 ENCOUNTER — Ambulatory Visit (INDEPENDENT_AMBULATORY_CARE_PROVIDER_SITE_OTHER): Payer: BLUE CROSS/BLUE SHIELD

## 2016-01-07 ENCOUNTER — Encounter: Payer: Self-pay | Admitting: Obstetrics & Gynecology

## 2016-01-07 ENCOUNTER — Other Ambulatory Visit: Payer: Self-pay

## 2016-01-07 VITALS — BP 126/74 | HR 80 | Resp 16 | Ht 63.0 in | Wt 249.0 lb

## 2016-01-07 DIAGNOSIS — Z0000 Encounter for general adult medical examination without abnormal findings: Secondary | ICD-10-CM | POA: Diagnosis not present

## 2016-01-07 DIAGNOSIS — N838 Other noninflammatory disorders of ovary, fallopian tube and broad ligament: Secondary | ICD-10-CM

## 2016-01-07 DIAGNOSIS — N839 Noninflammatory disorder of ovary, fallopian tube and broad ligament, unspecified: Secondary | ICD-10-CM | POA: Diagnosis not present

## 2016-01-07 DIAGNOSIS — Z01419 Encounter for gynecological examination (general) (routine) without abnormal findings: Secondary | ICD-10-CM

## 2016-01-07 LAB — LIPID PANEL
Cholesterol: 223 mg/dL — ABNORMAL HIGH (ref <200)
HDL: 47 mg/dL — AB (ref >50)
LDL Cholesterol: 143 mg/dL — ABNORMAL HIGH (ref <100)
TRIGLYCERIDES: 164 mg/dL — AB (ref <150)
Total CHOL/HDL Ratio: 4.7 Ratio (ref <5.0)
VLDL: 33 mg/dL — AB (ref <30)

## 2016-01-07 LAB — POCT URINALYSIS DIPSTICK
Bilirubin, UA: NEGATIVE
Blood, UA: NEGATIVE
GLUCOSE UA: NEGATIVE
Ketones, UA: NEGATIVE
Leukocytes, UA: NEGATIVE
NITRITE UA: NEGATIVE
Protein, UA: NEGATIVE
Urobilinogen, UA: NEGATIVE
pH, UA: 5

## 2016-01-07 LAB — HEPATIC FUNCTION PANEL
ALT: 54 U/L — AB (ref 6–29)
AST: 28 U/L (ref 10–35)
Albumin: 4.4 g/dL (ref 3.6–5.1)
Alkaline Phosphatase: 60 U/L (ref 33–130)
Bilirubin, Direct: 0.1 mg/dL (ref <=0.2)
Indirect Bilirubin: 0.4 mg/dL (ref 0.2–1.2)
TOTAL PROTEIN: 7 g/dL (ref 6.1–8.1)
Total Bilirubin: 0.5 mg/dL (ref 0.2–1.2)

## 2016-01-07 NOTE — Progress Notes (Signed)
56 y.o. G1P1 MarriedCaucasianF here for annual exam.  Doing well.  Just got back from [place] last week.  Her four grandchildren are there.  Last HbA1C was 6.0.  Thyroid medication is currently adjusted.  PCP:  Dr. Vista Deck.    Patient's last menstrual period was 01/03/1998.          Sexually active: Yes.    The current method of family planning is status post hysterectomy.    Exercising: No.  The patient does not participate in regular exercise at present. Smoker:  no  Health Maintenance: Pap:  12/25/14 negative History of abnormal Pap:  yes MMG:  01/20/15 BIRADS 1 negative  Colonoscopy:  2017 with Dr. Collene Mares- normal per patient.  Recheck 5 years. BMD:   12/10/10 normal  TDaP:  11/13  Pneumonia vaccine(s): never Zostavax:   never Hep C testing: 12/25/14 negative  Screening Labs: discuss with provider, Hb today: discuss with provider, Urine today: normal    reports that she has never smoked. She has never used smokeless tobacco. She reports that she does not drink alcohol or use drugs.  Past Medical History:  Diagnosis Date  . Diabetes ([place])    no medication  . High cholesterol    no medication  . Hypothyroidism   . Migraine    silent  . Nerve pain left elbow following IV  . Nerve pain right thigh  . 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 Sleep apnea     Past Surgical History:  Procedure Laterality Date  . BREAST REDUCTION SURGERY Bilateral 1997  . CARPAL TUNNEL RELEASE    . CERVICAL FUSION     [phone] and C6-7 2005  . HEMORRHOID SURGERY  11/2014   thrombosed hemorroid opened in office  . VAGINAL HYSTERECTOMY  2000    Current Outpatient Prescriptions  Medication Sig Dispense Refill  . Ascorbic Acid (VITAMIN C) 1000 MG tablet 1,000 mg daily.    Marland Kitchen b complex vitamins tablet Take 1 tablet by mouth daily.    [name] (BERBERINE COMPLEX PO) Take 500 mg by mouth.    . Cholecalciferol (VITAMIN D-3 PO) Take 2,000 Units by mouth daily.     .  Flaxseed, Linseed, (FLAXSEED OIL) 1000 MG CAPS Take by mouth. One daily    . liothyronine (CYTOMEL) 5 MCG tablet Take 10 mcg by mouth.    Marland Kitchen MAGNESIUM CITRATE PO Take by mouth daily.    Marland Kitchen omega-3 acid ethyl esters (LOVAZA) 1 g capsule Take 1 g by mouth daily.    Marland Kitchen OVER THE COUNTER MEDICATION Copper & saw palmetto    . PANCREATIN PO Take by mouth daily.    Marland Kitchen POTASSIUM PO Take 225 mcg by mouth.    . Probiotic Product (ACIDOPHILUS/GOAT MILK) CAPS daily.    . vitamin A 8000 UNIT capsule Take by mouth.     No current facility-administered medications for this visit.     Family History  Problem Relation Age of Onset  . Heart Problems Mother   . High blood pressure Mother   . Diabetes Mother   . Dementia Mother   . Gout Father   . Diabetes Father   . Colon cancer Father     ROS:  Pertinent items are noted in HPI.  Otherwise, a comprehensive ROS was negative.  Exam:   BP 126/74 (BP Location: Left Arm, Patient Position: Sitting, Cuff Size: Large)   Pulse 80   Resp 16   Ht 5\' 3"  (1.6 m)  Wt 249 lb (112.9 kg)   LMP 01/03/1998   BMI 44.11 kg/m   Weight change: @WEIGHTCHANGE @ Height:   Height: 5\' 3"  (160 cm)  Ht Readings from Last 3 Encounters:  01/07/16 5\' 3"  (1.6 m)  11/16/15 5\' 3"  (1.6 m)  12/25/14 5\' 3"  (1.6 m)    General appearance: alert, cooperative and appears stated age Head: Normocephalic, without obvious abnormality, atraumatic Neck: no adenopathy, supple, symmetrical, trachea midline and thyroid normal to inspection and palpation Lungs: clear to auscultation bilaterally Breasts: normal appearance, no masses or tenderness Heart: regular rate and rhythm Abdomen: soft, non-tender; bowel sounds normal; no masses,  no organomegaly Extremities: extremities normal, atraumatic, no cyanosis or edema Skin: Skin color, texture, turgor normal. No rashes or lesions Lymph nodes: Cervical, supraclavicular, and axillary nodes normal. No abnormal inguinal nodes palpated Neurologic:  Grossly normal   Pelvic: External genitalia:  no lesions              Urethra:  normal appearing urethra with no masses, tenderness or lesions              Bartholins and Skenes: normal                 Vagina: normal appearing vagina with normal color and discharge, no lesions              Cervix: no lesions              Pap taken: No. Bimanual Exam:  Uterus:  uterus absent              Adnexa: no mass, fullness, tenderness               Rectovaginal: Confirms               Anus:  normal sphincter tone, no lesions  Chaperone was present for exam.  A:   Well Woman with normal exam PMP, no HRT Type 2 Diabetes H/O multiple neck surgeries H/O complex ovarian cysts. Doing yearly ultrasounds.  P: Mammogram yearly.  Has follow scheduled in January. Pap smear 2016.  No pap today. Repeat PUS one year. No ca-125 obtained today due to appearance of ovaries on ulrtasound today.  Lipids and liver panel today Return annually or prn

## 2016-01-08 ENCOUNTER — Telehealth: Payer: Self-pay | Admitting: *Deleted

## 2016-01-08 NOTE — Telephone Encounter (Signed)
-----   Message from Megan Salon, MD sent at 01/08/2016 10:26 AM EST ----- Please inform pt that her toal cholesterol is 223 (was 238 and 254 in the last two years).  LDLs mildly elevated at 143 (was 160 and 156 in last two years.  Triglycerides were mildly elevated yesterday at 164 but I don't think she was fasting for this.  Her liver function showed the ALT to still be mildly elevated at 54 (was 37 and 45 in the past two years).  Can you confirm with her that her PCP has been following this and does she need these faxed to him?  Or has she seen a GI about this mild elevated?  Thanks.

## 2016-01-08 NOTE — Telephone Encounter (Signed)
Message left to return call to Lalo Tromp at [phone].    

## 2016-01-11 ENCOUNTER — Other Ambulatory Visit: Payer: Self-pay | Admitting: *Deleted

## 2016-01-11 DIAGNOSIS — D4959 Neoplasm of unspecified behavior of other genitourinary organ: Secondary | ICD-10-CM

## 2016-01-11 DIAGNOSIS — N83201 Unspecified ovarian cyst, right side: Secondary | ICD-10-CM

## 2016-01-11 DIAGNOSIS — N83202 Unspecified ovarian cyst, left side: Principal | ICD-10-CM

## 2016-01-11 NOTE — Telephone Encounter (Signed)
Patient is returning a call to Emily. °

## 2016-01-11 NOTE — Telephone Encounter (Signed)
Patient returning your call from friday

## 2016-01-11 NOTE — Telephone Encounter (Signed)
Patient returned call. Patient notified of all lab results and verbalized understanding. Patient states that she currently does not have a PCP, but is looking for one. She states she will mention this to her dermatologist/holistic MD that follows her thyroid levels. RN advised this message would be sent to Dr. Sabra Heck for review. Patient also requests a copy of her labs be mailed to her. Verbal release of records form filled out and taken to [facility] for completion.    Routing to provider for final review. Patient agreeable to disposition. Will close encounter.

## 2016-01-21 ENCOUNTER — Ambulatory Visit: Payer: BLUE CROSS/BLUE SHIELD

## 2016-01-22 ENCOUNTER — Ambulatory Visit
Admission: RE | Admit: 2016-01-22 | Discharge: 2016-01-22 | Disposition: A | Discharge status: Home / Self Care | Payer: BLUE CROSS/BLUE SHIELD | Source: Ambulatory Visit | Attending: Obstetrics & Gynecology | Admitting: Obstetrics & Gynecology

## 2016-01-22 DIAGNOSIS — Z1231 Encounter for screening mammogram for malignant neoplasm of breast: Secondary | ICD-10-CM

## 2016-02-08 ENCOUNTER — Telehealth: Payer: Self-pay | Admitting: Obstetrics & Gynecology

## 2016-02-08 ENCOUNTER — Encounter: Payer: Self-pay | Admitting: Obstetrics & Gynecology

## 2016-02-08 ENCOUNTER — Ambulatory Visit (INDEPENDENT_AMBULATORY_CARE_PROVIDER_SITE_OTHER): Payer: BLUE CROSS/BLUE SHIELD | Admitting: Obstetrics & Gynecology

## 2016-02-08 VITALS — BP 108/70 | HR 106 | Resp 14 | Ht 63.0 in | Wt 254.0 lb

## 2016-02-08 DIAGNOSIS — Z6841 Body Mass Index (BMI) 40.0 and over, adult: Secondary | ICD-10-CM | POA: Diagnosis not present

## 2016-02-08 DIAGNOSIS — N909 Noninflammatory disorder of vulva and perineum, unspecified: Secondary | ICD-10-CM

## 2016-02-08 DIAGNOSIS — E1169 Type 2 diabetes mellitus with other specified complication: Secondary | ICD-10-CM

## 2016-02-08 DIAGNOSIS — E669 Obesity, unspecified: Secondary | ICD-10-CM | POA: Diagnosis not present

## 2016-02-08 DIAGNOSIS — N9089 Other specified noninflammatory disorders of vulva and perineum: Secondary | ICD-10-CM

## 2016-02-08 NOTE — Telephone Encounter (Signed)
Patient states she has a painful lump inside her vagina. States it is swollen, not draining, no fever.  Would like to speak with a nurse and maybe schedule an appointment to get it looked at.

## 2016-02-08 NOTE — Telephone Encounter (Signed)
Spoke with patient. Patient states she has a painful lump inside of vagina on left side. Patient states she has used Epson salt baths for relief and noticed the lump would go down and come back up again after a few days. Patient denies fever, vaginal discharge or urinary complaints. Patient would like to get lump looked at. Patient scheduled with Dr. Sabra Heck at 2:30pm 02/08/16. Patient is agreeable to date and time.  Routing to provider for final review. Patient is agreeable to disposition. Will close encounter.

## 2016-02-11 ENCOUNTER — Encounter: Payer: Self-pay | Admitting: Obstetrics & Gynecology

## 2016-02-15 ENCOUNTER — Encounter: Payer: Self-pay | Admitting: Obstetrics & Gynecology

## 2016-02-15 DIAGNOSIS — Z6841 Body Mass Index (BMI) 40.0 and over, adult: Secondary | ICD-10-CM | POA: Insufficient documentation

## 2016-02-15 NOTE — Progress Notes (Signed)
GYNECOLOGY  VISIT   HPI: 56 y.o. G1P1 Married Caucasian female with complaint of recurrent left vulvar mass that "comes and goes" and is tender at times.  She's tried to see the area but just can't and her husband doesn't really see anything either.  Lesion is tender today and enlarged but has been larger in the past.  Never drains.  Denies vaginal bleeding.    Does not associate fever, bladder or bowel changes, vaginal discharge or blood when this occurs.  She has used Epsom salt in bath water at times and feels this helps.  She really hasn't tried anything else because she didn't really know what else to try.  GYNECOLOGIC HISTORY: Patient's last menstrual period was 01/03/1998. Contraception: hysterectomy Menopausal hormone therapy: none  Patient Active Problem List   Diagnosis Date Noted  . Acid reflux 11/12/2013  . HLD (hyperlipidemia) 11/12/2013  . Shoulder tendinitis 09/26/2013  . Backhand tennis elbow 07/29/2013  . Visual disturbance 05/10/2012  . Tinnitus 05/10/2012  . 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 Diabetes mellitus type 2 in obese ([place]) 04/09/2009  . Chronic nonalcoholic liver disease [id number]  . Barrett esophagus 06/20/2006  . Pure hypercholesterolemia 07/21/2005    Past Medical History:  Diagnosis Date  . Diabetes ([place])    no medication  . High cholesterol    no medication  . Hypothyroidism   . Hypothyroidism 2017   followed by Dr. Ignacia Felling in [place]  . Migraine    silent  . Nerve pain left elbow following IV  . Nerve pain right thigh  . 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 Sleep apnea     Past Surgical History:  Procedure Laterality Date  . BREAST REDUCTION SURGERY Bilateral 1997  . CARPAL TUNNEL RELEASE    . CERVICAL FUSION     [phone] and C6-7 2005  . HEMORRHOID SURGERY  11/2014   thrombosed hemorroid opened in office  . VAGINAL HYSTERECTOMY  2000    MEDS:  Reviewed in EPIC and UTD  ALLERGIES: Phenylephrine-guaifenesin;  Eggs or egg-derived products; Hydrocodone; Metoclopramide; Other; and Macrobid [nitrofurantoin monohyd macro]  Family History  Problem Relation Age of Onset  . Heart Problems Mother   . High blood pressure Mother   . Diabetes Mother   . Dementia Mother   . Gout Father   . Diabetes Father   . Colon cancer Father     SH:  Married, non smoker  Review of Systems  All other systems reviewed and are negative.   PHYSICAL EXAMINATION:    BP 108/70 (BP Location: Right Arm, Patient Position: Sitting, Cuff Size: Large)   Pulse (!) 106   Resp 14   Ht 5\' 3"  (1.6 m)   Wt 254 lb (115.2 kg)   LMP 01/03/1998   BMI 44.99 kg/m      Physical Exam  Constitutional: She is oriented to person, place, and time. She appears well-developed and well-nourished.  Cardiovascular: Normal rate and regular rhythm.   Pulmonary/Chest: Effort normal and breath sounds normal.  Genitourinary: Vagina normal.    There is no rash, tenderness, lesion or injury on the right labia. There is tenderness and lesion on the left labia. There is no rash or injury on the left labia.  Lymphadenopathy:       Right: No inguinal adenopathy present.       Left: No inguinal adenopathy present.  Neurological: She is alert and oriented to person, place, and time.  Skin: Skin is warm  and dry.  Psychiatric: She has a normal mood and affect.   Chaperone was present for exam.  Assessment: Left labial/vaginal mass, about pea sized today, and tender Obesity H/o OSA Hypothyroidism Diabetes, diet controlled Elevated lipids  Plan: D/w pt findings.  I am not completely sure what it is however it is firm and recurrent so removal is indicated.  Unlikely Skene's gland or urethral diverticulum due to location.  Removal in OR recommended for best visualization and to decrease risks which include but are not limited to bleeding, infection, abnormal finding requiring additional surgery, urethral injury, recurrence.  Pt would like to  proceed.  Will go ahead with surgical planning.  ~30 minutes spent with patient >50% of time was in face to face discussion of above.

## 2016-02-22 ENCOUNTER — Telehealth: Payer: Self-pay | Admitting: Obstetrics & Gynecology

## 2016-02-22 NOTE — Telephone Encounter (Signed)
Called patient to review benefits for a recommended surgical procedure. Left Voicemail requesting a call back. ° ° cc: Sally Yeakley °

## 2016-02-23 LAB — BASIC METABOLIC PANEL
BUN: 13 (ref 4–21)
CREATININE: 0.8 (ref 0.5–1.1)
Glucose: 123
POTASSIUM: 4.7 (ref 3.4–5.3)
Sodium: 141 (ref 137–147)

## 2016-02-23 LAB — TSH: TSH: 2.3 (ref 0.41–5.90)

## 2016-02-23 LAB — HEPATIC FUNCTION PANEL
ALK PHOS: 81 (ref 25–125)
ALT: 85 — AB (ref 7–35)
AST: 48 — AB (ref 13–35)
Bilirubin, Total: 0.4

## 2016-02-23 LAB — HEMOGLOBIN A1C: Hemoglobin A1C: 6.2 % — AB (ref 4.0–5.6)

## 2016-02-24 NOTE — Telephone Encounter (Signed)
Spoke with patient regarding benefit for surgery. Patient understood and agreeable. Patient aware this is professional benefit only. Patient aware will be contacted by hospital for separate benefits.Patient advises she will mail a check for her surgery prepayment. Patient also advised she has been diagnosed with shingles and will need to defer scheduling until she has recovered.  Forwarding to Nurse Supervisor to advised.   Forwarding to [employer]

## 2016-02-29 NOTE — Telephone Encounter (Signed)
Patients mailed payment for surgery had been received and posted. Chart forward to Nurse Supervisor for scheudling.  Routing to [employer]

## 2016-03-03 NOTE — Telephone Encounter (Signed)
Call to patient to discuss surgery date options. Left message to call back. 

## 2016-03-04 NOTE — Telephone Encounter (Signed)
Patient returns call. She is ready to proceed with surgery scheduling. Date options discussed and is agreeable to 03-14-16. Will call back once case confirmed.

## 2016-03-07 NOTE — Telephone Encounter (Signed)
Return call to pateint. Advised surgery confirmed for 03-14-16 at 1100 at [facility] instruction sheet reviewed and printed copy will be mailed.  Patient states she  HgbA1C with dermatologist in [place] on 02-23-16. Result was 6.2 and she will take these results to pre-op appointment at hospital. Multiple other labs were done. Given phone number to women's PAT to call and schedule pre-op appointment.   Routing to provider for final review. Patient agreeable to disposition. Will close encounter.

## 2016-03-07 NOTE — Telephone Encounter (Signed)
Patient calling and would like to know if her surgery has been scheduled and what time.

## 2016-03-08 ENCOUNTER — Other Ambulatory Visit: Payer: Self-pay | Admitting: Obstetrics & Gynecology

## 2016-03-09 NOTE — Patient Instructions (Addendum)
Your procedure is scheduled on:  Monday, March 14, 2016  Enter through the [employer] [facility] at:  9:30 AM  Pick up the phone at the desk and dial [phone].  Call this number if you have problems the morning of surgery: [phone].  Remember: Do NOT eat food or drink after:  Midnight Sunday  Take these medicines the morning of surgery with a SIP OF WATER:  Liothyronine  Bring CPAP machine day of surgery  Stop ALL herbal medications, flaxseed, fish oil, Ibuprofen and vitamin C at this time  Do NOT smoke the day of surgery.  Do NOT wear jewelry (body piercing), metal hair clips/bobby pins, make-up, or nail polish. Do NOT wear lotions, powders, or perfumes.  You may wear deodorant. Do NOT shave for 48 hours prior to surgery. Do NOT bring valuables to the hospital. Contacts, dentures, or bridgework may not be worn into surgery.  Have a responsible adult drive you home and stay with you for 24 hours after your procedure  Bring a copy of your healthcare power of attorney and living will documents.  **Effective Friday, Jan. 12, 2018, [facility] will implement no hospital visitations from children age 76 and younger due to a steady increase in flu activity in our community and hospitals. **

## 2016-03-10 ENCOUNTER — Encounter (HOSPITAL_COMMUNITY)
Admission: RE | Admit: 2016-03-10 | Discharge: 2016-03-10 | Disposition: A | Discharge status: Home / Self Care | Payer: BLUE CROSS/BLUE SHIELD | Source: Ambulatory Visit | Attending: Obstetrics & Gynecology | Admitting: Obstetrics & Gynecology

## 2016-03-10 ENCOUNTER — Encounter (INDEPENDENT_AMBULATORY_CARE_PROVIDER_SITE_OTHER): Payer: Self-pay

## 2016-03-10 ENCOUNTER — Encounter (HOSPITAL_COMMUNITY): Payer: Self-pay

## 2016-03-10 DIAGNOSIS — N909 Noninflammatory disorder of vulva and perineum, unspecified: Secondary | ICD-10-CM | POA: Diagnosis not present

## 2016-03-10 DIAGNOSIS — Z01818 Encounter for other preprocedural examination: Secondary | ICD-10-CM | POA: Insufficient documentation

## 2016-03-10 HISTORY — DX: Personal history of urinary calculi: Z87.442

## 2016-03-10 HISTORY — DX: Personal history of other diseases of the digestive system: Z87.19

## 2016-03-10 HISTORY — DX: Zoster without complications: B02.9

## 2016-03-10 HISTORY — DX: Other specified postprocedural states: Z98.890

## 2016-03-10 HISTORY — DX: Pain in left shoulder: M25.512

## 2016-03-10 HISTORY — DX: Other specified postprocedural states: R11.2

## 2016-03-10 HISTORY — DX: Fatty (change of) liver, not elsewhere classified: K76.0

## 2016-03-10 LAB — CBC
HCT: 43.3 % (ref 36.0–46.0)
HEMOGLOBIN: 14.3 g/dL (ref 12.0–15.0)
MCH: 29.8 pg (ref 26.0–34.0)
MCHC: 33 g/dL (ref 30.0–36.0)
MCV: 90.2 fL (ref 78.0–100.0)
Platelets: 230 10*3/uL (ref 150–400)
RBC: 4.8 MIL/uL (ref 3.87–5.11)
RDW: 14.1 % (ref 11.5–15.5)
WBC: 6.5 10*3/uL (ref 4.0–10.5)

## 2016-03-14 ENCOUNTER — Encounter (HOSPITAL_COMMUNITY)
Admission: RE | Disposition: A | Discharge status: Home / Self Care | Payer: Self-pay | Source: Ambulatory Visit | Attending: Obstetrics & Gynecology

## 2016-03-14 ENCOUNTER — Ambulatory Visit (HOSPITAL_COMMUNITY): Payer: BLUE CROSS/BLUE SHIELD | Admitting: Anesthesiology

## 2016-03-14 ENCOUNTER — Encounter (HOSPITAL_COMMUNITY): Payer: Self-pay | Admitting: *Deleted

## 2016-03-14 ENCOUNTER — Ambulatory Visit (HOSPITAL_COMMUNITY)
Admission: RE | Admit: 2016-03-14 | Discharge: 2016-03-14 | Disposition: A | Discharge status: Home / Self Care | Payer: BLUE CROSS/BLUE SHIELD | Source: Ambulatory Visit | Attending: Obstetrics & Gynecology | Admitting: Obstetrics & Gynecology

## 2016-03-14 DIAGNOSIS — E119 Type 2 diabetes mellitus without complications: Secondary | ICD-10-CM | POA: Diagnosis not present

## 2016-03-14 DIAGNOSIS — E7800 Pure hypercholesterolemia, unspecified: Secondary | ICD-10-CM | POA: Diagnosis not present

## 2016-03-14 DIAGNOSIS — Z885 Allergy status to narcotic agent status: Secondary | ICD-10-CM | POA: Diagnosis not present

## 2016-03-14 DIAGNOSIS — N899 Noninflammatory disorder of vagina, unspecified: Secondary | ICD-10-CM | POA: Diagnosis present

## 2016-03-14 DIAGNOSIS — I748 Embolism and thrombosis of other arteries: Secondary | ICD-10-CM | POA: Diagnosis not present

## 2016-03-14 DIAGNOSIS — Z6841 Body Mass Index (BMI) 40.0 and over, adult: Secondary | ICD-10-CM | POA: Insufficient documentation

## 2016-03-14 DIAGNOSIS — Z91012 Allergy to eggs: Secondary | ICD-10-CM | POA: Diagnosis not present

## 2016-03-14 DIAGNOSIS — N909 Noninflammatory disorder of vulva and perineum, unspecified: Secondary | ICD-10-CM | POA: Diagnosis not present

## 2016-03-14 DIAGNOSIS — Z882 Allergy status to sulfonamides status: Secondary | ICD-10-CM | POA: Insufficient documentation

## 2016-03-14 DIAGNOSIS — Z79899 Other long term (current) drug therapy: Secondary | ICD-10-CM | POA: Insufficient documentation

## 2016-03-14 HISTORY — PX: VULVECTOMY: SHX1086

## 2016-03-14 LAB — GLUCOSE, CAPILLARY: Glucose-Capillary: 133 mg/dL — ABNORMAL HIGH (ref 65–99)

## 2016-03-14 SURGERY — WIDE EXCISION VULVECTOMY
Anesthesia: General | Laterality: Left

## 2016-03-14 MED ORDER — LIDOCAINE HCL (CARDIAC) 20 MG/ML IV SOLN
INTRAVENOUS | Status: AC
  Filled 2016-03-14: qty 5

## 2016-03-14 MED ORDER — SCOPOLAMINE 1 MG/3DAYS TD PT72
1.0000 | MEDICATED_PATCH | Freq: Once | TRANSDERMAL | Status: DC
  Administered 2016-03-14: 1.5 mg via TRANSDERMAL

## 2016-03-14 MED ORDER — DEXAMETHASONE SODIUM PHOSPHATE 4 MG/ML IJ SOLN
INTRAMUSCULAR | Status: DC | PRN
  Administered 2016-03-14: 10 mg via INTRAVENOUS

## 2016-03-14 MED ORDER — SCOPOLAMINE 1 MG/3DAYS TD PT72
MEDICATED_PATCH | TRANSDERMAL | Status: AC
  Administered 2016-03-14: 1.5 mg via TRANSDERMAL
  Filled 2016-03-14: qty 1

## 2016-03-14 MED ORDER — LACTATED RINGERS IV SOLN
INTRAVENOUS | Status: DC
  Administered 2016-03-14: 10:00:00 via INTRAVENOUS

## 2016-03-14 MED ORDER — MIDAZOLAM HCL 2 MG/2ML IJ SOLN
INTRAMUSCULAR | Status: AC
  Filled 2016-03-14: qty 2

## 2016-03-14 MED ORDER — PROPOFOL 10 MG/ML IV BOLUS
INTRAVENOUS | Status: AC
  Filled 2016-03-14: qty 20

## 2016-03-14 MED ORDER — DEXAMETHASONE SODIUM PHOSPHATE 10 MG/ML IJ SOLN
INTRAMUSCULAR | Status: AC
  Filled 2016-03-14: qty 1

## 2016-03-14 MED ORDER — CEFAZOLIN SODIUM-DEXTROSE 2-3 GM-% IV SOLR
INTRAVENOUS | Status: DC | PRN
  Administered 2016-03-14: 2 g via INTRAVENOUS

## 2016-03-14 MED ORDER — ONDANSETRON HCL 4 MG/2ML IJ SOLN
INTRAMUSCULAR | Status: AC
  Filled 2016-03-14: qty 2

## 2016-03-14 MED ORDER — ACETAMINOPHEN 650 MG RE SUPP
650.0000 mg | RECTAL | Status: DC | PRN
  Filled 2016-03-14: qty 1

## 2016-03-14 MED ORDER — KETOROLAC TROMETHAMINE 30 MG/ML IJ SOLN
INTRAMUSCULAR | Status: DC | PRN
  Administered 2016-03-14: 30 mg via INTRAVENOUS

## 2016-03-14 MED ORDER — LIDOCAINE HCL 1 % IJ SOLN
INTRAMUSCULAR | Status: DC | PRN
  Administered 2016-03-14: 5 mL

## 2016-03-14 MED ORDER — SODIUM CHLORIDE 0.9 % IV SOLN: 250.0000 mL | INTRAVENOUS | Status: DC | PRN

## 2016-03-14 MED ORDER — ONDANSETRON HCL 4 MG/2ML IJ SOLN
INTRAMUSCULAR | Status: DC | PRN
  Administered 2016-03-14: 4 mg via INTRAVENOUS

## 2016-03-14 MED ORDER — GLYCOPYRROLATE 0.2 MG/ML IJ SOLN
INTRAMUSCULAR | Status: AC
  Filled 2016-03-14: qty 1

## 2016-03-14 MED ORDER — ACETAMINOPHEN 325 MG PO TABS: 650.0000 mg | ORAL_TABLET | ORAL | Status: DC | PRN

## 2016-03-14 MED ORDER — FENTANYL CITRATE (PF) 100 MCG/2ML IJ SOLN: 25.0000 ug | INTRAMUSCULAR | Status: DC | PRN

## 2016-03-14 MED ORDER — KETOROLAC TROMETHAMINE 30 MG/ML IJ SOLN
INTRAMUSCULAR | Status: AC
  Filled 2016-03-14: qty 1

## 2016-03-14 MED ORDER — LIDOCAINE 2% (20 MG/ML) 5 ML SYRINGE
INTRAMUSCULAR | Status: DC | PRN
  Administered 2016-03-14: 50 mg via INTRAVENOUS

## 2016-03-14 MED ORDER — FENTANYL CITRATE (PF) 250 MCG/5ML IJ SOLN
INTRAMUSCULAR | Status: AC
  Filled 2016-03-14: qty 5

## 2016-03-14 MED ORDER — MIDAZOLAM HCL 5 MG/5ML IJ SOLN
INTRAMUSCULAR | Status: DC | PRN
  Administered 2016-03-14 (×2): 1 mg via INTRAVENOUS

## 2016-03-14 MED ORDER — FENTANYL CITRATE (PF) 100 MCG/2ML IJ SOLN
INTRAMUSCULAR | Status: DC | PRN
  Administered 2016-03-14 (×2): 50 ug via INTRAVENOUS

## 2016-03-14 MED ORDER — PROPOFOL 10 MG/ML IV BOLUS
INTRAVENOUS | Status: DC | PRN
  Administered 2016-03-14: 10 mg via INTRAVENOUS
  Administered 2016-03-14: 20 mg via INTRAVENOUS

## 2016-03-14 MED ORDER — LIDOCAINE HCL 1 % IJ SOLN
INTRAMUSCULAR | Status: AC
  Filled 2016-03-14: qty 20

## 2016-03-14 MED ORDER — OXYCODONE-ACETAMINOPHEN 5-325 MG PO TABS: 1.0000 | ORAL_TABLET | Freq: Three times a day (TID) | ORAL | 0 refills | Status: DC | PRN

## 2016-03-14 MED ORDER — SODIUM CHLORIDE 0.9% FLUSH: 3.0000 mL | INTRAVENOUS | Status: DC | PRN

## 2016-03-14 MED ORDER — SODIUM CHLORIDE 0.9% FLUSH: 3.0000 mL | Freq: Two times a day (BID) | INTRAVENOUS | Status: DC

## 2016-03-14 SURGICAL SUPPLY — 30 items
BLADE SURG 11 STRL SS (BLADE) IMPLANT
BLADE SURG 15 STRL LF C SS BP (BLADE) ×1 IMPLANT
BLADE SURG 15 STRL SS (BLADE) ×1
CLOTH BEACON ORANGE TIMEOUT ST (SAFETY) ×2 IMPLANT
CONTAINER PREFILL 10% NBF 15ML (MISCELLANEOUS) ×2 IMPLANT
COUNTER NEEDLE 1200 MAGNETIC (NEEDLE) ×2 IMPLANT
DEPRESSOR TONGUE BLADE STERILE (MISCELLANEOUS) IMPLANT
ELECT NEEDLE TIP 2.8 STRL (NEEDLE) ×2 IMPLANT
ELECT REM PT RETURN 9FT ADLT (ELECTROSURGICAL) ×2
ELECTRODE REM PT RTRN 9FT ADLT (ELECTROSURGICAL) ×1 IMPLANT
GLOVE BIOGEL PI IND STRL 7.0 (GLOVE) ×3 IMPLANT
GLOVE BIOGEL PI INDICATOR 7.0 (GLOVE) ×3
GLOVE ECLIPSE 6.5 STRL STRAW (GLOVE) ×4 IMPLANT
GOWN STRL REUS W/TWL LRG LVL3 (GOWN DISPOSABLE) ×4 IMPLANT
NEEDLE HYPO 22GX1.5 SAFETY (NEEDLE) ×2 IMPLANT
NS IRRIG 1000ML POUR BTL (IV SOLUTION) ×2 IMPLANT
PACK VAGINAL MINOR WOMEN LF (CUSTOM PROCEDURE TRAY) ×2 IMPLANT
PAD OB MATERNITY 4.3X12.25 (PERSONAL CARE ITEMS) ×2 IMPLANT
PAD PREP 24X48 CUFFED NSTRL (MISCELLANEOUS) ×2 IMPLANT
PENCIL BUTTON HOLSTER BLD 10FT (ELECTRODE) ×2 IMPLANT
SUT SILK 2 0 SH (SUTURE) IMPLANT
SUT VIC AB 0 CT1 27 (SUTURE)
SUT VIC AB 0 CT1 27XBRD ANBCTR (SUTURE) IMPLANT
SUT VIC AB 3-0 SH 27 (SUTURE) ×1
SUT VIC AB 3-0 SH 27X BRD (SUTURE) ×1 IMPLANT
SYR CONTROL 10ML LL (SYRINGE) ×2 IMPLANT
TOWEL OR 17X24 6PK STRL BLUE (TOWEL DISPOSABLE) ×4 IMPLANT
TUBING NON-CON 1/4 X 20 CONN (TUBING) ×2 IMPLANT
WATER STERILE IRR 1000ML POUR (IV SOLUTION) IMPLANT
YANKAUER SUCT BULB TIP NO VENT (SUCTIONS) ×2 IMPLANT

## 2016-03-14 NOTE — H&P (Signed)
Jade Anderson is an 56 y.o. female G1P1 MWF here for excision of left labial/vaginal mass.  Lesion is small but tender.  It is round and mobile and superficial.  Due to body habitus, I felt proceeding in the OR was the best way to proceed with removal.  Risks, benefits, alternatives have been discussed and all questions answered.  Pertinent Gynecological History: Menses: none Contraception: hysterectomy DES exposure: denies Blood transfusions: none Sexually transmitted diseases: no past history Last mammogram: normal Date: 01/22/16 Last pap: normal Date: 12/16 OB History: G1, P1   Menstrual History: Patient's last menstrual period was 01/03/1998.    Past Medical History:  Diagnosis Date  . Diabetes ([place])    no medication  . Fatty liver   . High cholesterol    no medication  . History of hiatal hernia   . History of kidney stones   . Hypothyroidism 2017   followed by Dr. Ignacia Felling in [place]  . Migraine    silent  . Nerve pain left elbow following IV  . Nerve pain right thigh  . PONV (postoperative nausea and vomiting)   . 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 Shingles 2018  . Shoulder pain, left   . Sleep apnea     Past Surgical History:  Procedure Laterality Date  . BREAST REDUCTION SURGERY Bilateral 1997  . CARPAL TUNNEL RELEASE    . CERVICAL FUSION     [phone] and C6-7 2005  . COLONOSCOPY    . HEMORRHOID SURGERY  11/2014   thrombosed hemorroid opened in office  . VAGINAL HYSTERECTOMY  2000    Family History  Problem Relation Age of Onset  . Heart Problems Mother   . High blood pressure Mother   . Diabetes Mother   . Dementia Mother   . Gout Father   . Diabetes Father   . Colon cancer Father     Social History:  reports that she has never smoked. She has never used smokeless tobacco. She reports that she does not drink alcohol or use drugs.  Allergies:  Allergies  Allergen Reactions  . Eggs Or Egg-Derived Products     Upset GI  . Hydrocodone  Nausea And Vomiting  . Metoclopramide     Anxiety  . Other Nausea And Vomiting    ENTEX, Ragweed  . Macrobid [Nitrofurantoin Monohyd Macro] Rash    Prescriptions Prior to Admission  Medication Sig Dispense Refill Last Dose  . Amylase-Lipase-Protease (PANCREASE PO) Take 3 tablets by mouth 3 (three) times daily with meals.   Past Week at Unknown time  . Ascorbic Acid (VITAMIN C) 1000 MG tablet 1,000 mg daily.   Past Week at Unknown time  . b complex vitamins tablet Take 1 tablet by mouth daily.   Past Week at Unknown time  . Barberry-Oreg Grape-Goldenseal (BERBERINE COMPLEX PO) Take 500 mg by mouth 3 (three) times daily with meals.   Past Week at Unknown time  . Cholecalciferol (VITAMIN D3) 5000 units CAPS Take 5,000 Units by mouth every evening.   Past Week at Unknown time  . Flaxseed, Linseed, (FLAXSEED OIL) 1000 MG CAPS Take 1,000 mg by mouth daily.    Past Week at Unknown time  . ibuprofen (ADVIL,MOTRIN) 200 MG tablet Take 800 mg by mouth 3 (three) times daily as needed for headache or moderate pain.   Past Week at Unknown time  . liothyronine (CYTOMEL) 5 MCG tablet Take 10 mcg by mouth daily.    03/14/2016 at 0650  .  Magnesium 400 MG TABS Take 400 mg by mouth daily.   Past Week at Unknown time  . Omega-3 Fatty Acids (ULTRA OMEGA-3 FISH OIL PO) Take 1 capsule by mouth daily.   Past Week at Unknown time  . POTASSIUM IODIDE PO Take 1 tablet by mouth daily.   Past Week at Unknown time  . Saw Palmetto 160 MG TABS Take 160 mg by mouth daily.   Past Week at Unknown time  . Selenium 100 MCG TABS Take 100 mcg by mouth. On Friday Saturday and Sunday   Past Week at Unknown time    Review of Systems  All other systems reviewed and are negative.   Blood pressure 127/80, pulse 80, temperature 97.7 F (36.5 C), temperature source Oral, resp. rate 16, height 5\' 3"  (1.6 m), weight 249 lb (112.9 kg), last menstrual period 01/03/1998, SpO2 98 %. Physical Exam  Constitutional: She is oriented to  person, place, and time. She appears well-developed and well-nourished.  Cardiovascular: Normal rate and regular rhythm.   Respiratory: Effort normal and breath sounds normal.  Neurological: She is alert and oriented to person, place, and time.  Skin: Skin is warm and dry.  Psychiatric: She has a normal mood and affect.    No results found for this or any previous visit (from the past 24 hour(s)).  No results found.  Assessment/Plan: 56 yo G1P1 MWF here for excision of left labial/vaginal mass.  Questions answered.  Pt ready to proceed.  Hale Bogus SUZANNE 03/14/2016, 10:18 AM

## 2016-03-14 NOTE — Op Note (Signed)
03/14/2016  11:14 AM  PATIENT:  Jade Anderson  56 y.o. female  PRE-OPERATIVE DIAGNOSIS:  Left labial mass  POST-OPERATIVE DIAGNOSIS:  Left labial mass  PROCEDURE:  Procedure(s): LABIAL MASS EXCISION  SURGEON:  Benisha Hadaway SUZANNE  ASSISTANTS: OR staff   ANESTHESIA:   IV sedation  ESTIMATED BLOOD LOSS: 25cc  BLOOD ADMINISTERED:none   FLUIDS: 1000ccLR  UOP: 50cc clear UOP  SPECIMEN:  Uterus, cervix, fallopian tubes  DISPOSITION OF SPECIMEN:  PATHOLOGY  FINDINGS: firm, smooth, mobile, nodule in left labia majora  DESCRIPTION OF OPERATION: Patient was taken to the operating room.  She is placed in the supine position. SCDs were on her lower extremities and functioning properly. IV sedation was administered without difficulty. Dr. Lyndle Herrlich oversaw case.  Legs were then placed in the [place] in the low lithotomy position. The legs were lifted to the high lithotomy position.  Care was taken to ensure the pt's back, neck and legs were comfortable.  Betadine prep was used on the inner thighs perineum and vagina x3. Patient was draped in a normal standard fashion. An in and out catheterization with a red rubber Foley catheter was performed. Approximately 50 cc of clear urine was noted.   1% Lidocaine, 5cc's was instill underneath the lesion helping to lift the lesion.  This was clearly lateral to the urethra.  Using a #11 blade, the skin was incised.  Once this was deep enough, I could grasp the lesion between my left index finger and thumb.  The lesion was then excised with small Metzenbaum scissors.  The cyst wall and small nodule at the middle of the lesion was removed.  This fully removed the area of concern.  At this point, the incision was closed with a running interlocking 3.0 Vicryl without difficulty.  Excellent hemostasis was noted.    The prep was cleansed of the patient's skin. The legs are positioned back in the supine position. Sponge, lap, needle, initially  counts were correct x2. Patient was taken to recovery in stable condition.  COUNTS:  YES  PLAN OF CARE: Transfer to PACU

## 2016-03-14 NOTE — Anesthesia Preprocedure Evaluation (Addendum)
Anesthesia Evaluation  Patient identified by MRN, date of birth, ID band Patient awake    Reviewed: Allergy & Precautions, H&P , Patient's Chart, lab work & pertinent test results, reviewed documented beta blocker date and time   Airway Mallampati: II  TM Distance: >3 FB Neck ROM: full    Dental no notable dental hx.    Pulmonary sleep apnea ,    Pulmonary exam normal breath sounds clear to auscultation       Cardiovascular  Rhythm:regular Rate:Normal     Neuro/Psych    GI/Hepatic GERD  ,  Endo/Other  diabetes, Type 2Hypothyroidism Morbid obesity  Renal/GU      Musculoskeletal   Abdominal   Peds  Hematology   Anesthesia Other Findings   Reproductive/Obstetrics                            Anesthesia Physical Anesthesia Plan  ASA: III  Anesthesia Plan: MAC   Post-op Pain Management:    Induction: Intravenous  Airway Management Planned: Simple Face Mask and Mask  Additional Equipment:   Intra-op Plan:   Post-operative Plan:   Informed Consent: I have reviewed the patients History and Physical, chart, labs and discussed the procedure including the risks, benefits and alternatives for the proposed anesthesia with the patient or authorized representative who has indicated his/her understanding and acceptance.   Dental Advisory Given and Dental advisory given  Plan Discussed with: CRNA and Surgeon  Anesthesia Plan Comments: (Discussed GA with LMA, possible sore throat, potential need to switch to ETT, N/V, pulmonary aspiration. Questions answered. )       Anesthesia Quick Evaluation

## 2016-03-14 NOTE — Discharge Instructions (Addendum)
Post Anesthesia Home Care Instructions  Activity: Get plenty of rest for the remainder of the day. A responsible adult should stay with you for 24 hours following the procedure.  For the next 24 hours, DO NOT: -Drive a car -Paediatric nurse -Drink alcoholic beverages -Take any medication unless instructed by your physician -Make any legal decisions or sign important papers.  Meals: Start with liquid foods such as gelatin or soup. Progress to regular foods as tolerated. Avoid greasy, spicy, heavy foods. If nausea and/or vomiting occur, drink only clear liquids until the nausea and/or vomiting subsides. Call your physician if vomiting continues.  Special Instructions/Symptoms: Your throat may feel dry or sore from the anesthesia or the breathing tube placed in your throat during surgery. If this causes discomfort, gargle with warm salt water. The discomfort should disappear within 24 hours.  If you had a scopolamine patch placed behind your ear for the management of post- operative nausea and/or vomiting:  1. The medication in the patch is effective for 72 hours, after which it should be removed.  Wrap patch in a tissue and discard in the trash. Wash hands thoroughly with soap and water. 2. You may remove the patch earlier than 72 hours if you experience unpleasant side effects which may include dry mouth, dizziness or visual disturbances. 3. Avoid touching the patch. Wash your hands with soap and water after contact with the patch.   Post-surgical Instructions, Outpatient Surgery  You may expect to feel dizzy, weak, and drowsy for as long as 24 hours after receiving the medicine that made you sleep (anesthetic). For the first 24 hours after your surgery:    Do not drive a car, ride a bicycle, participate in physical activities, or take public transportation until you are done taking narcotic pain medicines or as directed by Dr. Sabra Heck.   Do not drink alcohol or take tranquilizers.    Do not take medicine that has not been prescribed by your physicians.   Do not sign important papers or make important decisions while on narcotic pain medicines.   Have a responsible person with you.   CARE OF INCISION  You may shower on the first day after your surgery.  Do not sit in a tub bath for one week.  Avoid heavy lifting (more than 10 pounds/4.5 kilograms), pushing, or pulling.   Nothing in the vaginal for 4 weeks.    PAIN MANAGEMENT  Motrin 800mg .  (This is the same as 4-200mg  over the counter tablets of Motrin or ibuprofen.)  You may take this every eight hours or as needed for cramping.    Percocet 5/325mg .  1-2 tabs every 8 hours as needed.  (Remember that narcotic pain medications increase your risk of constipation.  If this becomes a problem, you may take an over the counter stool softener like Colace 100mg  up to four times a day.)  DO'S AND DON'T'S  Do not take a tub bath for one week.  You may shower on the first day after your surgery  Do not do any heavy lifting for one week.  This increases the chance of bleeding.  Do move around as you feel able.  Stairs are fine.  You may begin to exercise again as you feel able.  Do not lift any weights for two weeks.  Do not put anything in the vagina for FOUR weeks--no tampons, intercourse, or douching.    REGULAR MEDIATIONS/VITAMINS:  You may restart all of your regular medications as prescribed.  You may restart all of your vitamins as you normally take them.    PLEASE CALL OR SEEK MEDICAL CARE IF:  You have persistent nausea and vomiting.   You have trouble eating or drinking.   You have an oral temperature above 100.5.   You have constipation that is not helped by adjusting diet or increasing fluid intake. Pain medicines are a common cause of constipation.   You have heavy vaginal bleeding  You have redness or drainage from your incision(s) or there is increasing pain or tenderness near or in the  surgical site.

## 2016-03-14 NOTE — Transfer of Care (Signed)
Immediate Anesthesia Transfer of Care Note  Patient: Jade Anderson  Procedure(s) Performed: Procedure(s) with comments: LABIAL MASS EXCISION (Left) - follow first case  Patient Location: PACU  Anesthesia Type:MAC  Level of Consciousness: awake, alert  and oriented  Airway & Oxygen Therapy: Patient Spontanous Breathing  Post-op Assessment: Report given to RN and Post -op Vital signs reviewed and stable  Post vital signs: Reviewed and stable  Last Vitals:  Vitals:   03/14/16 0906  BP: 127/80  Pulse: 80  Resp: 16  Temp: 36.5 C    Last Pain:  Vitals:   03/14/16 0906  TempSrc: Oral  PainSc: 3       Patients Stated Pain Goal: 4 (62/37/62 8315)  Complications: No apparent anesthesia complications

## 2016-03-15 ENCOUNTER — Encounter (HOSPITAL_COMMUNITY): Payer: Self-pay | Admitting: Obstetrics & Gynecology

## 2016-03-15 NOTE — Anesthesia Postprocedure Evaluation (Signed)
Anesthesia Post Note  Patient: Jade Anderson  Procedure(s) Performed: Procedure(s) (LRB): LABIAL MASS EXCISION (Left)  Patient location during evaluation: PACU Anesthesia Type: MAC Level of consciousness: awake and alert Pain management: pain level controlled Vital Signs Assessment: post-procedure vital signs reviewed and stable Respiratory status: spontaneous breathing, nonlabored ventilation, respiratory function stable and patient connected to nasal cannula oxygen Cardiovascular status: stable and blood pressure returned to baseline Anesthetic complications: no       Last Vitals:  Vitals:   03/14/16 1145 03/14/16 1200  BP: 126/70 123/71  Pulse: 75 80  Resp: 14 19  Temp:      Last Pain:  Vitals:   03/14/16 1145  TempSrc:   PainSc: 3                  Cherrie Franca EDWARD

## 2016-03-16 ENCOUNTER — Telehealth: Payer: Self-pay | Admitting: Obstetrics & Gynecology

## 2016-03-16 NOTE — Telephone Encounter (Signed)
error 

## 2016-03-21 ENCOUNTER — Ambulatory Visit: Payer: BLUE CROSS/BLUE SHIELD | Admitting: Obstetrics & Gynecology

## 2016-03-31 ENCOUNTER — Encounter: Payer: Self-pay | Admitting: Obstetrics & Gynecology

## 2016-03-31 ENCOUNTER — Ambulatory Visit (INDEPENDENT_AMBULATORY_CARE_PROVIDER_SITE_OTHER): Payer: BLUE CROSS/BLUE SHIELD | Admitting: Obstetrics & Gynecology

## 2016-03-31 VITALS — BP 120/60 | HR 84 | Resp 16 | Ht 63.0 in | Wt 251.0 lb

## 2016-03-31 DIAGNOSIS — N909 Noninflammatory disorder of vulva and perineum, unspecified: Secondary | ICD-10-CM

## 2016-03-31 DIAGNOSIS — B373 Candidiasis of vulva and vagina: Secondary | ICD-10-CM

## 2016-03-31 DIAGNOSIS — R748 Abnormal levels of other serum enzymes: Secondary | ICD-10-CM

## 2016-03-31 DIAGNOSIS — B3731 Acute candidiasis of vulva and vagina: Secondary | ICD-10-CM

## 2016-03-31 DIAGNOSIS — N9089 Other specified noninflammatory disorders of vulva and perineum: Secondary | ICD-10-CM

## 2016-03-31 MED ORDER — FLUCONAZOLE 150 MG PO TABS: ORAL_TABLET | ORAL | 0 refills | Status: DC

## 2016-03-31 NOTE — Patient Instructions (Signed)
[facility] [address] [place], [place] [address] P: [phone] [email or web address]

## 2016-03-31 NOTE — Progress Notes (Signed)
Post Operative Visit  Procedure:LABIAL MASS EXCISION  Days Post-op: 17 days   Subjective: Doing well.  Reports had bleeding for a day or two post op.  Never took any Percocet.  Used advil only.  Has lots of questions today regarding pathology report.  Answered these to best of my ability.  Pt feels like she may have a yeast infection.  Having some whitish discharge with associated itching.  Had mildly elevated liver enzyme in January.  Does not want to see her PCP.  Follow up was recommended.  She wants to discuss possible causes.  Fatty liver disease is most likely in her.  Highly encouraged her to follow up with new PCP (names given) vs GI.  Pt states she will schedule appt with new PCP.  Declines referral.   Objective: BP 120/60 (BP Location: Right Arm, Patient Position: Sitting, Cuff Size: Large)   Pulse 84   Resp 16   Ht 5\' 3"  (1.6 m)   Wt 251 lb (113.9 kg)   LMP 01/03/1998   BMI 44.46 kg/m   EXAM General: alert and cooperative Resp: clear to auscultation bilaterally Cardio: regular rate and rhythm, S1, S2 normal, no murmur, click, rub or gallop GI: soft, non-tender; bowel sounds normal; no masses,  no organomegaly  Gyn:  NAEFG, excision site healing well, sutures removed today, white clumpy vaginal discharge noted, cervix and uterus absent, no pelvic tenderness Extremities: extremities normal, atraumatic, no cyanosis or edema Vaginal Bleeding: none  Assessment: s/p excision of 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  Rx to pharmacy.

## 2016-07-15 NOTE — Anesthesia Postprocedure Evaluation (Signed)
Anesthesia Post Note  Patient: Jade Anderson  Procedure(s) Performed: Procedure(s) (LRB): LABIAL MASS EXCISION (Left)     Anesthesia Post Evaluation  Last Vitals:  Vitals:   03/14/16 1145 03/14/16 1200  BP: 126/70 123/71  Pulse: 75 80  Resp: 14 19  Temp:      Last Pain:  Vitals:   03/15/16 1136  TempSrc:   PainSc: 1                  Cordarius Benning EDWARD

## 2016-07-15 NOTE — Addendum Note (Signed)
Addendum  created 07/15/16 1451 by Lyndle Herrlich, MD   Sign clinical note

## 2016-11-15 ENCOUNTER — Ambulatory Visit: Payer: BLUE CROSS/BLUE SHIELD | Admitting: Nurse Practitioner

## 2016-11-20 NOTE — Progress Notes (Signed)
[facility]  PATIENT: Jade Anderson DOB: 1960/05/20   REASON FOR VISIT: Follow-up for bilateral tinnitus, visual disturbance, headache HISTORY FROM: Patient    HISTORY OF PRESENT ILLNESS:UPDATE 11/21/16 CMMs Jade Anderson , 56 year old returns for yearly followup.She is only having rare episodes of tinnitus and seeing flashing lights in the peripheral vision. These episodes are rare and she does not wish to be on medication. The episodes occur once a month or less.  She denies any blurred vision loss of vision .She also has a history of migraine but these are under control. She continues to be evaluated at [facility] for her cervical spine.  She has had 2 spinal fusions in the past.  She is seen at the [place] for Barretts esophagus. She returns for reevaluation.She has no new complaints.   HISTORY: She continues to have mild episodes of right tinnitus, seeing flashlight in the peripheral visual field. MRI of the brain done after her last visit was normal. She has a history of migraine headaches but these sensations are different.She is not have daily headache. 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 She's also had 2 cervical spine fusions and sees Dr. Owens Shark at [facility] most recently 4 weeks ago. He wants to do another fusion but she is holding off.    REVIEW OF SYSTEMS: Full 14 system review of systems performed and notable only for those listed, all others are neg:  Constitutional: neg  Cardiovascular: neg Ear/Nose/Throat: neg  Skin: neg Eyes: Occasional visual disturbance Respiratory: neg Gastroitestinal: Constipation Hematology/Lymphatic: neg  Endocrine: neg Musculoskeletal:neg Allergy/Immunology: neg Neurological: neg Psychiatric: neg Sleep : Obstructive sleep apnea with CPAP followed at [facility]    ALLERGIES: Allergies  Allergen Reactions  . Eggs Or Egg-Derived Products     Upset GI  .  Hydrocodone Nausea And Vomiting  . Metoclopramide     Anxiety  . Other Nausea And Vomiting    ENTEX, Ragweed  . Macrobid [Nitrofurantoin Monohyd Macro] Rash    HOME MEDICATIONS: Outpatient Medications Prior to Visit  Medication Sig Dispense Refill  . Amylase-Lipase-Protease (PANCREASE PO) Take 3 tablets by mouth 3 (three) times daily with meals.    . Ascorbic Acid (VITAMIN C) 1000 MG tablet 1,000 mg daily.    Marland Kitchen b complex vitamins tablet Take 1 tablet by mouth daily.    [name] (BERBERINE COMPLEX PO) Take 500 mg by mouth 3 (three) times daily with meals.    . Cholecalciferol (VITAMIN D3) 5000 units CAPS Take 5,000 Units by mouth every evening.    . Flaxseed, Linseed, (FLAXSEED OIL) 1000 MG CAPS Take 1,000 mg by mouth daily.     . fluconazole (DIFLUCAN) 150 MG tablet Take one tablet.  Repeat in 72 hours if symptoms are not completely resolved. 2 tablet 0  . ibuprofen (ADVIL,MOTRIN) 200 MG tablet Take 800 mg by mouth 3 (three) times daily as needed for headache or moderate pain.    . Magnesium 400 MG TABS Take 400 mg by mouth daily.    . Omega-3 Fatty Acids (ULTRA OMEGA-3 FISH OIL PO) Take 1 capsule by mouth daily.    Marland Kitchen liothyronine (CYTOMEL) 5 MCG tablet Take 10 mcg by mouth daily.     Marland Kitchen POTASSIUM IODIDE PO Take 1 tablet by mouth daily.    . Saw Palmetto 160 MG TABS Take 160 mg by mouth daily.    . Selenium 100 MCG TABS Take  100 mcg by mouth. On Friday Saturday and Sunday    . vitamin A 8000 UNIT capsule Take by mouth daily.     No facility-administered medications prior to visit.     PAST MEDICAL HISTORY: Past Medical History:  Diagnosis Date  . Diabetes ([place])    no medication  . Fatty liver   . High cholesterol    no medication  . History of hiatal hernia   . History of kidney stones   . Hypothyroidism 2017   followed by Dr. Ignacia Felling in [place]  . Migraine    silent  . Nerve pain left elbow following IV  . Nerve pain right thigh  . PONV  (postoperative nausea and vomiting)   . 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 Shingles 2018  . Shoulder pain, left   . Sleep apnea     PAST SURGICAL HISTORY: Past Surgical History:  Procedure Laterality Date  . BREAST REDUCTION SURGERY Bilateral 1997  . CARPAL TUNNEL RELEASE    . CERVICAL FUSION     [phone] and C6-7 2005  . COLONOSCOPY    . HEMORRHOID SURGERY  11/2014   thrombosed hemorroid opened in office  . LABIAL MASS EXCISION Left 03/14/2016   Performed by Megan Salon, MD at [facility]  . VAGINAL HYSTERECTOMY  2000    FAMILY HISTORY: Family History  Problem Relation Age of Onset  . Heart Problems Mother   . High blood pressure Mother   . Diabetes Mother   . Dementia Mother   . Gout Father   . Diabetes Father   . Colon cancer Father     SOCIAL HISTORY: Social History   Socioeconomic History  . Marital status: Married    Spouse name: Francee Piccolo  . Number of children: 1  . Years of education: 66  . Highest education level: Not on file  Social Needs  . Financial resource strain: Not on file  . Food insecurity - worry: Not on file  . Food insecurity - inability: Not on file  . Transportation needs - medical: Not on file  . Transportation needs - non-medical: Not on file  Occupational History  . Not on file  Tobacco Use  . Smoking status: Never Smoker  . Smokeless tobacco: Never Used  Substance and Sexual Activity  . Alcohol use: No    Alcohol/week: 0.0 oz  . Drug use: No  . Sexual activity: Yes    Partners: Male    Birth control/protection: Surgical    Comment: TAH  Other Topics Concern  . Not on file  Social History Narrative   Patient lives at home with her husband Francee Piccolo. Patient has one child and a high school education.    Patient is right-handed.  Patient drinks one cup of tea daily and occasionally a diet soda.     PHYSICAL EXAM  Vitals:   11/21/16 0751  BP: 135/87  Pulse: 98  Weight: 260 lb (117.9 kg)   Body mass index is 46.06  kg/m. Generalized: Well developed, obese female in no acute distress, Neurological examination   Mentation: Alert oriented to time, place, history taking. Follows all commands speech and language fluent  Cranial nerve II-XII: Visual acuity 20/40 bil. Pupils were equal round reactive to light extraocular movements were full, visual field were full on confrontational test. Facial sensation and strength were normal. hearing was intact to finger rubbing bilaterally. Uvula tongue midline. head turning and shoulder shrug and were normal and symmetric.Tongue protrusion  into cheek strength was normal. Motor: normal bulk and tone, full strength in the BUE, BLE, fine finger movements normal, no pronator drift.  Coordination: finger-nose-finger, heel-to-shin bilaterally, no dysmetria Reflexes: Symmetric upper and lower,  plantar responses were flexor bilaterally. Gait and Station: Rising up from seated position without assistance, normal stance, moderate stride, good arm swing, smooth turning, able to perform tiptoe, and heel walking without difficulty. Tandem gait is steady   DIAGNOSTIC DATA (LABS, IMAGING, TESTING) -  ASSESSMENT AND PLAN  56 y.o. year old female   has a past medical history of Diabetes; High cholesterol; Sleep apnea; Migraine; and tinnitus here to follow up. Her episodes of tinnitus are rare. She is not on any preventive medication;  here to follow-up.  Episodes of flashing light continue to be intermittent to rare Patient does not wish to be on any preventive medication at this time Keep a record if they become more frequent Follow-up yearly and when necessary [name], [facility], [facility], APRN  [facility] [address] [place], [place] [address] [phone]

## 2016-11-21 ENCOUNTER — Encounter (INDEPENDENT_AMBULATORY_CARE_PROVIDER_SITE_OTHER): Payer: Self-pay

## 2016-11-21 ENCOUNTER — Ambulatory Visit: Payer: BLUE CROSS/BLUE SHIELD | Admitting: Nurse Practitioner

## 2016-11-21 ENCOUNTER — Encounter: Payer: Self-pay | Admitting: Nurse Practitioner

## 2016-11-21 VITALS — BP 135/87 | HR 98 | Wt 260.0 lb

## 2016-11-21 DIAGNOSIS — H539 Unspecified visual disturbance: Secondary | ICD-10-CM | POA: Diagnosis not present

## 2016-11-21 NOTE — Patient Instructions (Signed)
Episodes of flashing light continued to be intermittent rare Patient to keep a record if they become more frequent Follow-up yearly and as needed

## 2016-11-22 NOTE — Progress Notes (Signed)
I have reviewed and agreed above plan. 

## 2016-12-05 ENCOUNTER — Encounter: Payer: Self-pay | Admitting: Obstetrics & Gynecology

## 2016-12-05 ENCOUNTER — Other Ambulatory Visit: Payer: Self-pay

## 2016-12-05 ENCOUNTER — Telehealth: Payer: Self-pay | Admitting: Obstetrics & Gynecology

## 2016-12-05 ENCOUNTER — Ambulatory Visit: Payer: BLUE CROSS/BLUE SHIELD | Admitting: Obstetrics & Gynecology

## 2016-12-05 VITALS — BP 136/72 | HR 96 | Resp 16 | Wt 261.1 lb

## 2016-12-05 DIAGNOSIS — N898 Other specified noninflammatory disorders of vagina: Secondary | ICD-10-CM

## 2016-12-05 DIAGNOSIS — R102 Pelvic and perineal pain: Secondary | ICD-10-CM | POA: Diagnosis not present

## 2016-12-05 MED ORDER — TERCONAZOLE 0.4 % VA CREA: 1.0000 | TOPICAL_CREAM | Freq: Every day | VAGINAL | 0 refills | Status: DC

## 2016-12-05 NOTE — Telephone Encounter (Signed)
Patient states she has surgery back in April and is still having a lot of pain and discomfort.  States she may have a little discharge but not certain.

## 2016-12-05 NOTE — Progress Notes (Signed)
GYNECOLOGY  VISIT  CC:   Vaginal discharge, vulvar/hip pain   HPI: 56 y.o. G1P1 Married Caucasian female here for complaint of questionable vulvar pain that is somewhat ill-defined.  Pt has small inclusion cyst removed from left labia 3/18.  Pt states pain is somewhat like that pain but feels deeper and radiated to the left.  Husband noticed some vaginal discharge as well.  Denies odor or itching.  Bladder function is normal.  Intercourse is newly painful.  This seems to be more bothersome as of late as well.    Both patient and her husband's mother are in nursing homes.  His mother has glioblastoma and pt's mother has dementia.  Will see grandchildren in January.  Pt has known right ovarian cyst that has been followed with ultrasound.  Last pelvic ultrasound was 1/18 and was stable.  Left ovary was normal.  Denies vaginal bleeding.   GYNECOLOGIC HISTORY: Patient's last menstrual period was 01/03/1998. Contraception: hysterectomy Menopausal hormone therapy: none  Patient Active Problem List   Diagnosis Date Noted  . BMI 40.0-44.9, adult ([place]) 02/15/2016  . Acid reflux 11/12/2013  . HLD (hyperlipidemia) 11/12/2013  . Shoulder tendinitis 09/26/2013  . Backhand tennis elbow 07/29/2013  . Visual disturbance 05/10/2012  . Tinnitus 05/10/2012  . 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 Diabetes mellitus type 2 in obese ([place]) 04/09/2009  . Chronic nonalcoholic liver disease 70/35/0093  . Barrett esophagus 06/20/2006  . Pure hypercholesterolemia 07/21/2005    Past Medical History:  Diagnosis Date  . Diabetes ([place])    no medication  . Fatty liver   . High cholesterol    no medication  . History of hiatal hernia   . History of kidney stones   . Hypothyroidism 2017   followed by Dr. Ignacia Felling in [place]  . Migraine    silent  . Nerve pain left elbow following IV  . Nerve pain right thigh  . PONV (postoperative nausea and vomiting)   . 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  Shingles 2018  . Shoulder pain, left   . Sleep apnea     Past Surgical History:  Procedure Laterality Date  . BREAST REDUCTION SURGERY Bilateral 1997  . CARPAL TUNNEL RELEASE    . CERVICAL FUSION     [phone] and C6-7 2005  . COLONOSCOPY    . HEMORRHOID SURGERY  11/2014   thrombosed hemorroid opened in office  . VAGINAL HYSTERECTOMY  2000  . VULVECTOMY Left 03/14/2016   Procedure: LABIAL MASS EXCISION;  Surgeon: Megan Salon, MD;  Location: [facility];  Service: Gynecology;  Laterality: Left;  follow first case    MEDS:   Current Outpatient Medications on File Prior to Visit  Medication Sig Dispense Refill  . Amylase-Lipase-Protease (PANCREASE PO) Take 3 tablets by mouth 3 (three) times daily with meals.    . Ascorbic Acid (VITAMIN C) 1000 MG tablet 1,000 mg daily.    Marland Kitchen b complex vitamins tablet Take 1 tablet by mouth daily.    [name] (BERBERINE COMPLEX PO) Take 500 mg by mouth 3 (three) times daily with meals.    . Cholecalciferol (VITAMIN D3) 5000 units CAPS Take 5,000 Units by mouth every evening.    Marland Kitchen ibuprofen (ADVIL,MOTRIN) 200 MG tablet Take 800 mg by mouth 3 (three) times daily as needed for headache or moderate pain.    . Magnesium 400 MG TABS Take 400 mg by mouth daily.    . Omega-3 Fatty Acids (  ULTRA OMEGA-3 FISH OIL PO) Take 1 capsule by mouth daily.     No current facility-administered medications on file prior to visit.     ALLERGIES: Eggs or egg-derived products; Hydrocodone; Metoclopramide; Other; and Macrobid [nitrofurantoin monohyd macro]  Family History  Problem Relation Age of Onset  . Heart Problems Mother   . High blood pressure Mother   . Diabetes Mother   . Dementia Mother   . Gout Father   . Diabetes Father   . Colon cancer Father     SH:  Married, non smoker  Review of Systems  Constitutional: Negative.   Respiratory: Negative.   Cardiovascular: Negative.   Genitourinary: Negative.     PHYSICAL EXAMINATION:     BP 136/72 (BP Location: Right Arm, Patient Position: Sitting, Cuff Size: Large)   Pulse 96   Resp 16   Wt 261 lb 1.6 oz (118.4 kg)   LMP 01/03/1998   BMI 46.25 kg/m     General appearance: alert, cooperative and appears stated age Abdomen: soft, non-tender; bowel sounds normal; no masses,  no organomegaly Inguinal:  No LAD  Pelvic: External genitalia:  no lesions              Urethra:  normal appearing urethra with no masses, tenderness or lesions              Bartholins and Skenes: normal                 Vagina: normal appearing vagina with normal color and watery discharge noted, no lesions              Cervix: absent              Bimanual Exam:  Uterus:  uterus absent              Adnexa: no mass, fullness, tenderness              Anus:  normal sphincter tone, no lesions  Pain seems to be most reproduced when left ischial spine is compressed.    Chaperone was present for exam.  Assessment: Left vulvar pain/discomfort with normal vulvar exam and normal pelvic exam Known right ovarian cyst that was stable with ultrasound 1/18  Plan: Consider referral to ortho for evaluation as pelvic exam is normal today except for discomfort with palpation of pelvic bone. Vaginitis testing obtained.   ~20 minutes spent with patient >50% of time was in face to face discussion of above.

## 2016-12-05 NOTE — Progress Notes (Signed)
Scheduled patient while in office to see Dr.Voytek at [facility] on 12/12/2016 at 1:30 am. Patient is agreeable to date and time.

## 2016-12-05 NOTE — Telephone Encounter (Signed)
Spoke with patient. Patient states she had a left vulvar mass removed in 03/2016, same pain she was experiencing has returned. Reports pain inside vagina goes to the left hip. Started 6 wks ago intermittent, now constant. Requesting OV.  Reports vaginal burning and creamy white d/c. No odor, itching or urinary complaints.   Patient states she lives an hour away. OV scheduled for today at 11:30am with Dr. Sabra Heck. Patient is agreeable to date and time.   Routing to provider for final review. Patient is agreeable to disposition. Will close encounter.

## 2016-12-07 LAB — VAGINITIS/VAGINOSIS, DNA PROBE
Candida Species: NEGATIVE
Gardnerella vaginalis: NEGATIVE
TRICHOMONAS VAG: NEGATIVE

## 2016-12-14 ENCOUNTER — Other Ambulatory Visit: Payer: Self-pay | Admitting: Obstetrics & Gynecology

## 2016-12-14 DIAGNOSIS — Z139 Encounter for screening, unspecified: Secondary | ICD-10-CM

## 2017-01-30 ENCOUNTER — Ambulatory Visit
Admission: RE | Admit: 2017-01-30 | Discharge: 2017-01-30 | Disposition: A | Discharge status: Home / Self Care | Payer: BLUE CROSS/BLUE SHIELD | Source: Ambulatory Visit | Attending: Obstetrics & Gynecology | Admitting: Obstetrics & Gynecology

## 2017-01-30 DIAGNOSIS — Z139 Encounter for screening, unspecified: Secondary | ICD-10-CM

## 2017-03-09 ENCOUNTER — Ambulatory Visit (INDEPENDENT_AMBULATORY_CARE_PROVIDER_SITE_OTHER): Payer: BLUE CROSS/BLUE SHIELD

## 2017-03-09 ENCOUNTER — Ambulatory Visit (INDEPENDENT_AMBULATORY_CARE_PROVIDER_SITE_OTHER): Payer: BLUE CROSS/BLUE SHIELD | Admitting: Obstetrics & Gynecology

## 2017-03-09 ENCOUNTER — Encounter: Payer: Self-pay | Admitting: Obstetrics & Gynecology

## 2017-03-09 ENCOUNTER — Telehealth: Payer: Self-pay | Admitting: Obstetrics & Gynecology

## 2017-03-09 ENCOUNTER — Other Ambulatory Visit: Payer: Self-pay

## 2017-03-09 VITALS — BP 120/80 | HR 88 | Resp 16 | Ht 63.25 in | Wt 257.0 lb

## 2017-03-09 DIAGNOSIS — Z0000 Encounter for general adult medical examination without abnormal findings: Secondary | ICD-10-CM

## 2017-03-09 DIAGNOSIS — E669 Obesity, unspecified: Secondary | ICD-10-CM | POA: Diagnosis not present

## 2017-03-09 DIAGNOSIS — Z01419 Encounter for gynecological examination (general) (routine) without abnormal findings: Secondary | ICD-10-CM

## 2017-03-09 DIAGNOSIS — D4959 Neoplasm of unspecified behavior of other genitourinary organ: Secondary | ICD-10-CM | POA: Diagnosis not present

## 2017-03-09 DIAGNOSIS — N83201 Unspecified ovarian cyst, right side: Secondary | ICD-10-CM

## 2017-03-09 DIAGNOSIS — E1169 Type 2 diabetes mellitus with other specified complication: Secondary | ICD-10-CM

## 2017-03-09 DIAGNOSIS — N83202 Unspecified ovarian cyst, left side: Secondary | ICD-10-CM

## 2017-03-09 NOTE — Progress Notes (Signed)
57 y.o. G1P1 MarriedCaucasianF here for annual exam.  Denies vaginal bleeding.  Having some right mid abdominal pain.    Was treated by dermatologist in [place], [place], with thyroid medication for mild hypothyroidism and hair loss.  This didn't help and was stopped about six months ago.    Having some RUQ pain that seems to radiate from back.    Having PUS today.  Known hx of ovarian cysts.  S/p hysterectomy.  Cysts have been present at least four years.    PCP:  Dr. Vista Deck.  Last appt was "awhile ago".  Pt aware I really think she needs regular follow up with PCP due to her diabetes.  Has not had a recent HbA1C check either.  Patient's last menstrual period was 01/03/1998.          Sexually active: Yes.    The current method of family planning is status post hysterectomy.    Exercising: Yes.    walking Smoker:  no  Health Maintenance: Pap:  12/25/14 Neg  History of abnormal Pap:  yes MMG:  01/30/17 BIRADS1:Neg  Colonoscopy:  07/15/15 f/u 5 years  BMD:   12/10/10  TDaP:  2013 Pneumonia vaccine(s):  n/a Shingrix:   No.  Declines vaccination.   Hep C testing: 12/25/14 neg  Screening Labs: Here today   reports that  has never smoked. she has never used smokeless tobacco. She reports that she does not drink alcohol or use drugs.  Past Medical History:  Diagnosis Date  . Diabetes ([place])    no medication  . Fatty liver   . High cholesterol    no medication  . History of hiatal hernia   . History of kidney stones   . Hypothyroidism 2017   followed by Dr. Ignacia Felling in [place]  . Migraine    silent  . Nerve pain left elbow following IV  . Nerve pain right thigh  . PONV (postoperative nausea and vomiting)   . 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 Shingles 2018  . Shoulder pain, left   . Sleep apnea     Past Surgical History:  Procedure Laterality Date  . BREAST REDUCTION SURGERY Bilateral 1997  . CARPAL TUNNEL RELEASE    . CERVICAL FUSION     [phone] and C6-7 2005  .  COLONOSCOPY    . HEMORRHOID SURGERY  11/2014   thrombosed hemorroid opened in office  . VAGINAL HYSTERECTOMY  2000  . VULVECTOMY Left 03/14/2016   Procedure: LABIAL MASS EXCISION;  Surgeon: Megan Salon, MD;  Location: [facility];  Service: Gynecology;  Laterality: Left;  follow first case    Current Outpatient Medications  Medication Sig Dispense Refill  . Amylase-Lipase-Protease (PANCREASE PO) Take 3 tablets by mouth 3 (three) times daily with meals.    . Ascorbic Acid (VITAMIN C) 1000 MG tablet 1,000 mg daily.    Marland Kitchen b complex vitamins tablet Take 1 tablet by mouth daily.    [name] (BERBERINE COMPLEX PO) Take 500 mg by mouth 3 (three) times daily with meals.    . Cholecalciferol (VITAMIN D3) 5000 units CAPS Take 5,000 Units by mouth every evening.    Marland Kitchen ibuprofen (ADVIL,MOTRIN) 200 MG tablet Take 800 mg by mouth 3 (three) times daily as needed for headache or moderate pain.    . Magnesium 400 MG TABS Take 400 mg by mouth daily.    . Omega-3 Fatty Acids (ULTRA OMEGA-3 FISH OIL PO) Take 1 capsule by mouth  daily.    . TURMERIC PO Take by mouth daily.     No current facility-administered medications for this visit.     Family History  Problem Relation Age of Onset  . Heart Problems Mother   . High blood pressure Mother   . Diabetes Mother   . Dementia Mother   . Gout Father   . Diabetes Father   . Colon cancer Father     ROS:  Pertinent items are noted in HPI.  Otherwise, a comprehensive ROS was negative.  Exam:   BP 120/80 (BP Location: Left Arm, Patient Position: Sitting, Cuff Size: Large)   Pulse 88   Resp 16   Ht 5' 3.25" (1.607 m)   Wt 257 lb (116.6 kg)   LMP 01/03/1998   BMI 45.17 kg/m   Weight change:+8#  Height: 5' 3.25" (160.7 cm)  Ht Readings from Last 3 Encounters:  03/09/17 5' 3.25" (1.607 m)  03/31/16 5\' 3"  (1.6 m)  03/14/16 5\' 3"  (1.6 m)    General appearance: alert, cooperative and appears stated age Head: Normocephalic, without  obvious abnormality, atraumatic Neck: no adenopathy, supple, symmetrical, trachea midline and thyroid normal to inspection and palpation Lungs: clear to auscultation bilaterally Breasts: normal appearance, no masses or tenderness Heart: regular rate and rhythm Abdomen: soft, non-tender; bowel sounds normal; no masses,  no organomegaly Extremities: extremities normal, atraumatic, no cyanosis or edema Skin: Skin color, texture, turgor normal. No rashes or lesions Lymph nodes: Cervical, supraclavicular, and axillary nodes normal. No abnormal inguinal nodes palpated Neurologic: Grossly normal   Pelvic: External genitalia:  no lesions              Urethra:  normal appearing urethra with no masses, tenderness or lesions              Bartholins and Skenes: normal                 Vagina: normal appearing vagina with normal color and discharge, no lesions              Cervix: absent              Pap taken: No. Bimanual Exam:  Uterus:  uterus absent              Adnexa: no mass, fullness, tenderness               Rectovaginal: Confirms               Anus:  normal sphincter tone, no lesions  Chaperone was present for exam.  Ultrasound performed today as well. Uterus and cervix surgically absent Left ovary: 2.2 x 1.4 x 1.3 cm.  No cyst present Right ovary:  2.1 x 1.7 x 1.6 cm.  2 cysts are present measuring 2.3 by 1.9 x 2.1 cm and 1.3 x 1.2 cm.  Both are avascular with smooth margins but have thickened walls the smaller cyst does have some internal echoes.  With comparison to ultrasounds back in 2014, both are smaller. Cul-de-sac: No free fluid Patient's body habitus and overlying bowel gas does make imaging more difficult in this patient.  A:  Well Woman with normal exam PMP, no HRT Type 2 diabetes Obesity H/o multiple neck surgeries H/O complex ovarian cysts.  Doing yearly PUS.  H/o neg ca-125 levels in the past as well.  P:   Mammogram guidelines reviewed.   pap smear not  indicated CMP, Vit D, hbA1C, and TSH obtained Pt desires  to continue yearly PUS.  Will be scheduled with AEX in one year. return annually or prn  In addition to AEX, ~ additional 15 minutes was spent in face-to-face discussion with patient regarding her ultrasound.  Comparisons were made to previous imaging.

## 2017-03-09 NOTE — Telephone Encounter (Signed)
Patient is requesting to schedule her aex with PUS for next year.

## 2017-03-09 NOTE — Telephone Encounter (Signed)
Yes. This is fine. Thanks.  

## 2017-03-09 NOTE — Telephone Encounter (Signed)
Left message to call Araiyah Cumpton at [phone].  

## 2017-03-09 NOTE — Telephone Encounter (Signed)
Dr. Sabra Heck -ok to schedule PUS with AEX for 2020?

## 2017-03-10 LAB — COMPREHENSIVE METABOLIC PANEL
A/G RATIO: 1.6 (ref 1.2–2.2)
ALT: 76 IU/L — ABNORMAL HIGH (ref 0–32)
AST: 38 IU/L (ref 0–40)
Albumin: 4.5 g/dL (ref 3.5–5.5)
Alkaline Phosphatase: 84 IU/L (ref 39–117)
BILIRUBIN TOTAL: 0.4 mg/dL (ref 0.0–1.2)
BUN/Creatinine Ratio: 14 (ref 9–23)
BUN: 13 mg/dL (ref 6–24)
CHLORIDE: 104 mmol/L (ref 96–106)
CO2: 26 mmol/L (ref 20–29)
Calcium: 9.5 mg/dL (ref 8.7–10.2)
Creatinine, Ser: 0.9 mg/dL (ref 0.57–1.00)
GFR, EST AFRICAN AMERICAN: 83 mL/min/{1.73_m2} (ref >59)
GFR, EST NON AFRICAN AMERICAN: 72 mL/min/{1.73_m2} (ref >59)
GLOBULIN, TOTAL: 2.8 g/dL (ref 1.5–4.5)
Glucose: 100 mg/dL — ABNORMAL HIGH (ref 65–99)
POTASSIUM: 4.6 mmol/L (ref 3.5–5.2)
SODIUM: 142 mmol/L (ref 134–144)
Total Protein: 7.3 g/dL (ref 6.0–8.5)

## 2017-03-10 LAB — TSH: TSH: 2.25 u[IU]/mL (ref 0.450–4.500)

## 2017-03-10 LAB — HEMOGLOBIN A1C
ESTIMATED AVERAGE GLUCOSE: 143 mg/dL
Hgb A1c MFr Bld: 6.6 % — ABNORMAL HIGH (ref 4.8–5.6)

## 2017-03-10 LAB — VITAMIN D 25 HYDROXY (VIT D DEFICIENCY, FRACTURES): Vit D, 25-Hydroxy: 41.7 ng/mL (ref 30.0–100.0)

## 2017-03-10 NOTE — Telephone Encounter (Signed)
Spoke with patient. PUS with AEX scheduled for 03/15/18 at 12:30pm, consult to follow at 1pm with Dr. Sabra Heck.   Routing to provider for final review. Patient is agreeable to disposition. Will close encounter.

## 2017-03-13 ENCOUNTER — Other Ambulatory Visit: Payer: Self-pay | Admitting: Obstetrics & Gynecology

## 2017-03-13 DIAGNOSIS — E119 Type 2 diabetes mellitus without complications: Secondary | ICD-10-CM

## 2017-03-13 NOTE — Progress Notes (Signed)
Referral to endocrinology placed

## 2017-05-11 ENCOUNTER — Ambulatory Visit: Payer: BLUE CROSS/BLUE SHIELD | Admitting: Internal Medicine

## 2017-05-11 ENCOUNTER — Encounter: Payer: Self-pay | Admitting: Internal Medicine

## 2017-05-11 VITALS — BP 152/94 | HR 63 | Ht 63.25 in | Wt 259.8 lb

## 2017-05-11 DIAGNOSIS — E1165 Type 2 diabetes mellitus with hyperglycemia: Secondary | ICD-10-CM | POA: Diagnosis not present

## 2017-05-11 MED ORDER — METFORMIN HCL 1000 MG PO TABS: 1000.0000 mg | ORAL_TABLET | Freq: Every day | ORAL | 3 refills | Status: DC

## 2017-05-11 NOTE — Progress Notes (Signed)
Patient ID: Jade Anderson, female   DOB: 11-Sep-1960, 57 y.o.   MRN: [id number]   HPI: Jade Anderson is a 57 y.o.-year-old female, referred by her PCP, Dr. Edwinna Areola, for management of DM2, dx in 2010 non-insulin-dependent, without long term complications.  Last hemoglobin A1c was: Lab Results  Component Value Date   HGBA1C 6.6 (H) 03/09/2017   HGBA1C 6.0 (H) 12/25/2014   HGBA1C 5.7 (H) 12/19/2013  2010: HbA1c 9.4%  Patient is not on any medicines for her diabetes. She was initially started on Metformin. She then changed her diet after seeing the nutritionist >> HbA1c dropped to 4.9%. However, since then, she relaxed her diet >> HbA1c started to increase.  She recently had a steroid inj ~1 mo ago >> sugars higher.  Pt checks her sugars 0-1x a day and they are: - am: 120-160 - 2h after b'fast: n/c - before lunch: n/c - 2h after lunch: n/c - before dinner: n/c - 2h after dinner: n/c - bedtime: n/c - nighttime: n/c No lows. Lowest sugar was 120; she has hypoglycemia awareness at 70.  Highest sugar was 160.  Glucometer: ReliOn  Pt's meals are: - Breakfast: Oatmeal, sausage, bacon, biscuits, hash brown, fruit, peanut butter  (allergic to eggs) - Lunch: Lunch meat, hamburger, hot dog, salads - Dinner: Organic beef or chicken, vegetables, salads, bread - Snacks: 3: Fruit, nuts Occasional fast food.  - no CKD, last BUN/creatinine:  Lab Results  Component Value Date   BUN 13 03/09/2017   BUN 14 02/09/2015   CREATININE 0.90 03/09/2017   CREATININE 0.87 02/09/2015   - + HL; last set of lipids: Lab Results  Component Value Date   CHOL 223 (H) 01/07/2016   HDL 47 (L) 01/07/2016   LDLCALC 143 (H) 01/07/2016   TRIG 164 (H) 01/07/2016   CHOLHDL 4.7 01/07/2016  On Omega 3 FA. Intolerant to statins >> Lipitor >> mm cramps/weakness. Would not want to restart. - last eye exam was in Spring 2019. No DR.  - no numbness and tingling in her feet.  Pt has FH of DM in M, F,  brothers.  She has a history of fatty liver.  She had hair loss >> TFTs normal but at ULN >> started LT3 >> worsening diabetes >> stopped LT3 after 1 year.  ROS: Constitutional: no weight gain/loss, no fatigue, no subjective hyperthermia/hypothermia Eyes: no blurry vision, no xerophthalmia ENT: no sore throat, no nodules palpated in throat, no dysphagia/odynophagia, no hoarseness Cardiovascular: no CP/SOB/palpitations/leg swelling Respiratory: no cough/SOB Gastrointestinal: no N/V/D/C Musculoskeletal: no muscle/joint aches Skin: no rashes, + hair loss, + excessive hair growth on face Neurological: no tremors/numbness/tingling/dizziness Psychiatric: no depression/anxiety + Low libido  Past Medical History:  Diagnosis Date  . Diabetes ([place])    no medication  . Fatty liver   . High cholesterol    no medication  . History of hiatal hernia   . History of kidney stones   . Hypothyroidism 2017   followed by Dr. Ignacia Felling in [place]  . Migraine    silent  . Nerve pain left elbow following IV  . Nerve pain right thigh  . PONV (postoperative nausea and vomiting)   . 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 Shingles 2018  . Shoulder pain, left   . Sleep apnea    Past Surgical History:  Procedure Laterality Date  . BREAST REDUCTION SURGERY Bilateral 1997  . CARPAL TUNNEL RELEASE    . CERVICAL FUSION  [id number] and C6-7 2005  . COLONOSCOPY    . HEMORRHOID SURGERY  11/2014   thrombosed hemorroid opened in office  . VAGINAL HYSTERECTOMY  2000  . VULVECTOMY Left 03/14/2016   Procedure: LABIAL MASS EXCISION;  Surgeon: Megan Salon, MD;  Location: [facility];  Service: Gynecology;  Laterality: Left;  follow first case   Social History   Socioeconomic History  . Marital status: Married    Spouse name: Jade Anderson  . Number of children: 1  . Years of education: 76  . Highest education level: Not on file  Occupational History  . housewife  Tobacco Use  . Smoking status: Never  Smoker  . Smokeless tobacco: Never Used  Substance and Sexual Activity  . Alcohol use: No    Alcohol/week: 0.0 oz  . Drug use: No  . Sexual activity: Yes    Partners: Male    Birth control/protection: Surgical    Comment: TAH  Social History Narrative   Patient lives at home with her husband Jade Anderson. Patient has one child and a high school education.    Patient is right-handed.  Patient drinks one cup of tea daily and occasionally a diet soda.   Current Outpatient Medications on File Prior to Visit  Medication Sig Dispense Refill  . Amylase-Lipase-Protease (PANCREASE PO) Take 3 tablets by mouth 3 (three) times daily with meals.    . Ascorbic Acid (VITAMIN C) 1000 MG tablet 1,000 mg daily.    Marland Kitchen b complex vitamins tablet Take 1 tablet by mouth daily.    [name] (BERBERINE COMPLEX PO) Take 500 mg by mouth 3 (three) times daily with meals.    . Cholecalciferol (VITAMIN D3) 5000 units CAPS Take 5,000 Units by mouth every evening.    . Choline Dihydrogen Citrate (CHOLINE CITRATE PO) Take by mouth.    Marland Kitchen ibuprofen (ADVIL,MOTRIN) 200 MG tablet Take 800 mg by mouth 3 (three) times daily as needed for headache or moderate pain.    . INOSITOL PO Take 1 capsule by mouth daily.    . Magnesium 400 MG TABS Take 400 mg by mouth daily.    . Omega-3 Fatty Acids (ULTRA OMEGA-3 FISH OIL PO) Take 1 capsule by mouth daily.    . TURMERIC PO Take by mouth daily.     No current facility-administered medications on file prior to visit.    Allergies  Allergen Reactions  . Eggs Or Egg-Derived Products     Upset GI  . Hydrocodone Nausea And Vomiting  . Metoclopramide     Anxiety  . Other Nausea And Vomiting    ENTEX, Ragweed  . Macrobid [Nitrofurantoin Monohyd Macro] Rash   Family History  Problem Relation Age of Onset  . Heart Problems Mother   . High blood pressure Mother   . Diabetes Mother   . Dementia Mother   . Gout Father   . Diabetes Father   . Colon cancer Father      PE: BP (!) 152/94   Pulse 63   Ht 5' 3.25" (1.607 m)   Wt 259 lb 12.8 oz (117.8 kg)   LMP 01/03/1998   SpO2 90%   BMI 45.66 kg/m  Wt Readings from Last 3 Encounters:  05/11/17 259 lb 12.8 oz (117.8 kg)  03/09/17 257 lb (116.6 kg)  12/05/16 261 lb 1.6 oz (118.4 kg)   Constitutional: obese, in NAD Eyes: PERRLA, EOMI, no exophthalmos ENT: moist mucous membranes, no thyromegaly, no cervical lymphadenopathy Cardiovascular: RRR, No MRG Respiratory:  CTA B Gastrointestinal: abdomen soft, NT, ND, BS+ Musculoskeletal: no deformities, strength intact in all 4 Skin: moist, warm, no rashes Neurological: no tremor with outstretched hands, DTR normal in all 4  ASSESSMENT: 1. DM2, non-insulin-dependent, becoming uncontrolled, without long term complications, but with hyperglycemia  PLAN:  1. Patient with long-standing, prev. Diet-controlled diabetes, with worsening control lately 2/2 relaxing her diet. We discussed about the concept of insulin resistance and the need for improving diet. I suggested a low fat, whole food plant based diet. Discussed about consequences including some resistance and also the advantages the diet.  Given her references. - We also discussed about adding metformin at dinnertime to improve her sugars in the morning.  She tolerated this well in the past and agrees to start.  We will started a low dose and advance to 1000 mg daily with dinner - I suggested to:  Patient Instructions  Please start Metformin 500 mg daily with dinner x 3 days, then increase to 1000 mg with dinner.  Check sugars 1x a day, rotating check times.  Please return in 3 months with your sugar log.   - Strongly advised her to start checking sugars at different times of the day - check once a day, rotating checks - given sugar log and advised how to fill it and to bring it at next appt  - given foot care handout and explained the principles  - given instructions for hypoglycemia management  "15-15 rule"  - advised for yearly eye exams >> she is up-to-date - Return to clinic in 3 mo with sugar log   Philemon Kingdom, MD PhD [facility] Endocrinology

## 2017-05-11 NOTE — Patient Instructions (Addendum)
Please start Metformin 500 mg daily with dinner x 3 days, then increase to 1000 mg with dinner.  Check sugars 1x a day, rotating check times.  Please return in 3 months with your sugar log.   PATIENT INSTRUCTIONS FOR TYPE 2 DIABETES:  **Please join MyChart!** - see attached instructions about how to join if you have not done so already.  DIET AND EXERCISE Diet and exercise is an important part of diabetic treatment.  We recommended aerobic exercise in the form of brisk walking (working between 40-60% of maximal aerobic capacity, similar to brisk walking) for 150 minutes per week (such as 30 minutes five days per week) along with 3 times per week performing 'resistance' training (using various gauge rubber tubes with handles) 5-10 exercises involving the major muscle groups (upper body, lower body and core) performing 10-15 repetitions (or near fatigue) each exercise. Start at half the above goal but build slowly to reach the above goals. If limited by weight, joint pain, or disability, we recommend daily walking in a swimming pool with water up to waist to reduce pressure from joints while allow for adequate exercise.    BLOOD GLUCOSES Monitoring your blood glucoses is important for continued management of your diabetes. Please check your blood glucoses 2-4 times a day: fasting, before meals and at bedtime (you can rotate these measurements - e.g. one day check before the 3 meals, the next day check before 2 of the meals and before bedtime, etc.).   HYPOGLYCEMIA (low blood sugar) Hypoglycemia is usually a reaction to not eating, exercising, or taking too much insulin/ other diabetes drugs.  Symptoms include tremors, sweating, hunger, confusion, headache, etc. Treat IMMEDIATELY with 15 grams of Carbs: . 4 glucose tablets .  cup regular juice/soda . 2 tablespoons raisins . 4 teaspoons sugar . 1 tablespoon honey Recheck blood glucose in 15 mins and repeat above if still symptomatic/blood  glucose <100.  RECOMMENDATIONS TO REDUCE YOUR RISK OF DIABETIC COMPLICATIONS: * Take your prescribed MEDICATION(S) * Follow a DIABETIC diet: Complex carbs, fiber rich foods, (monounsaturated and polyunsaturated) fats * AVOID saturated/trans fats, high fat foods, >2,300 mg salt per day. * EXERCISE at least 5 times a week for 30 minutes or preferably daily.  * DO NOT SMOKE OR DRINK more than 1 drink a day. * Check your FEET every day. Do not wear tightfitting shoes. Contact us if you develop an ulcer * See your EYE doctor once a year or more if needed * Get a FLU shot once a year * Get a PNEUMONIA vaccine once before and once after age 52 years  GOALS:  * Your Hemoglobin A1c of <7%  * fasting sugars need to be <130 * after meals sugars need to be <180 (2h after you start eating) * Your Systolic BP should be 938 or lower  * Your Diastolic BP should be 80 or lower  * Your HDL (Good Cholesterol) should be 40 or higher  * Your LDL (Bad Cholesterol) should be 100 or lower. * Your Triglycerides should be 150 or lower  * Your Urine microalbumin (kidney function) should be <30 * Your Body Mass Index should be 25 or lower    Please consider the following ways to cut down carbs and fat and increase fiber and micronutrients in your diet: - substitute whole grain for white bread or pasta - substitute brown rice for white rice - substitute 90-calorie flat bread pieces for slices of bread when possible - substitute sweet potatoes  or yams for white potatoes - substitute humus for margarine - substitute tofu for cheese when possible - substitute almond or rice milk for regular milk (would not drink soy milk daily due to concern for soy estrogen influence on breast cancer risk) - substitute dark chocolate for other sweets when possible - substitute water - can add lemon or orange slices for taste - for diet sodas (artificial sweeteners will trick your body that you can eat sweets without getting  calories and will lead you to overeating and weight gain in the long run) - do not skip breakfast or other meals (this will slow down the metabolism and will result in more weight gain over time)  - can try smoothies made from fruit and almond/rice milk in am instead of regular breakfast - can also try old-fashioned (not instant) oatmeal made with almond/rice milk in am - order the dressing on the side when eating salad at a restaurant (pour less than half of the dressing on the salad) - eat as little meat as possible - can try juicing, but should not forget that juicing will get rid of the fiber, so would alternate with eating raw veg./fruits or drinking smoothies - use as little oil as possible, even when using olive oil - can dress a salad with a mix of balsamic vinegar and lemon juice, for e.g. - use agave nectar, stevia sugar, or regular sugar rather than artificial sweateners - steam or broil/roast veggies  - snack on veggies/fruit/nuts (unsalted, preferably) when possible, rather than processed foods - reduce or eliminate aspartame in diet (it is in diet sodas, chewing gum, etc) Read the labels!  Try to read Dr. Janene Harvey book: "Program for Reversing Diabetes" for other ideas for healthy eating.

## 2017-09-08 ENCOUNTER — Ambulatory Visit: Payer: BLUE CROSS/BLUE SHIELD | Admitting: Internal Medicine

## 2017-09-08 ENCOUNTER — Encounter: Payer: Self-pay | Admitting: Internal Medicine

## 2017-09-08 VITALS — BP 114/56 | HR 113 | Temp 98.1°F | Ht 63.25 in | Wt 252.6 lb

## 2017-09-08 DIAGNOSIS — E1165 Type 2 diabetes mellitus with hyperglycemia: Secondary | ICD-10-CM

## 2017-09-08 DIAGNOSIS — E782 Mixed hyperlipidemia: Secondary | ICD-10-CM | POA: Diagnosis not present

## 2017-09-08 DIAGNOSIS — E042 Nontoxic multinodular goiter: Secondary | ICD-10-CM | POA: Insufficient documentation

## 2017-09-08 LAB — POCT GLYCOSYLATED HEMOGLOBIN (HGB A1C): Hemoglobin A1C: 5.7 % — AB (ref 4.0–5.6)

## 2017-09-08 MED ORDER — METFORMIN HCL 500 MG PO TABS
ORAL_TABLET | ORAL | 3 refills | Status: DC
Start: 2017-09-08 — End: 2017-09-11

## 2017-09-08 NOTE — Progress Notes (Addendum)
Patient ID: Jade Anderson, female   DOB: Jan 09, 1960, 57 y.o.   MRN: [id number]   HPI: Jade Anderson is a 57 y.o.-year-old female, initially referred by her PCP, Dr. Edwinna Areola, returning for follow-up for DM2, dx in 2010 non-insulin-dependent, without long term complications.  Last visit 6 months ago.  Last hemoglobin A1c was: Lab Results  Component Value Date   HGBA1C 6.6 (H) 03/09/2017   HGBA1C 6.0 (H) 12/25/2014   HGBA1C 5.7 (H) 12/19/2013  2010: HbA1c 9.4%  At last visit, she was not on any diabetes medicines.  She was previously on metformin but came off after changing her diet as her HbA1c dropped to 4.9%.   At last visit we started: - Metformin 1000 mg 2x a day -she is tolerating this well except some diarrhea  Pt checks her sugars 0 to once a day a day and they are: - am: 120-160 >> 112-139 - 2h after b'fast: n/c - before lunch: n/c>> 89-121 - 2h after lunch: n/c >> 113 - before dinner: n/c >> 94-130 - 2h after dinner: n/c >> 131 - bedtime: n/c >> 103-135 - nighttime: n/c Lowest sugar was 120 >> 89; she has hypoglycemia awareness in the 70s Highest sugar was 160 >> 137.  She has a h/o pancreatitis as a child (2/2 mumps).  Glucometer: ReliOn  Pt's meals are: - Breakfast: Oatmeal, sausage, bacon, biscuits, hash brown, fruit, peanut butter  (allergic to eggs) - Lunch: Lunch meat, hamburger, hot dog, salads - Dinner: Organic beef or chicken, vegetables, salads, bread - Snacks: 3: Fruit, nuts Occasional fast food.  -No CKD, last BUN/creatinine:  Lab Results  Component Value Date   BUN 13 03/09/2017   BUN 14 02/09/2015   CREATININE 0.90 03/09/2017   CREATININE 0.87 02/09/2015   -+ HL; last set of lipids: Lab Results  Component Value Date   CHOL 223 (H) 01/07/2016   HDL 47 (L) 01/07/2016   LDLCALC 143 (H) 01/07/2016   TRIG 164 (H) 01/07/2016   CHOLHDL 4.7 01/07/2016  On omega-3 fatty acids. Intolerant to statins >> she tried Lipitor and developed muscle  cramps and weakness..  She does not want to restart statins. - last eye exam was in spring 2019: No DR - No numbness and tingling in her feet.  She has a history of fatty liver.  She had hair loss >> TFTs normal but at ULN >> started LT3 >> worsening diabetes >> stopped LT3 after 1 year.  Last TSH: Lab Results  Component Value Date   TSH 2.250 03/09/2017   She has a h/o thyroid nodules: 0.6-0.9 cm bilaterally per U/S 03/31/2015.  She had a repeat U/S 10/08/2015 ([place]).  ROS: Constitutional: no weight gain/no weight loss,+ fatigue, no subjective hyperthermia, no subjective hypothermia Eyes: no blurry vision, no xerophthalmia ENT: no sore throat, no nodules palpated in throat, + chronic dysphagia (has h/o esophagea strictures - postop), no odynophagia, no hoarseness Cardiovascular: no CP/no SOB/no palpitations/no leg swelling Respiratory: no cough/no SOB/no wheezing Gastrointestinal: no N/no V/+ D/no C/no acid reflux Musculoskeletal: no muscle aches/no joint aches Skin: no rashes, + hair loss Neurological: no tremors/no numbness/no tingling/no dizziness  I reviewed pt's medications, allergies, PMH, social hx, family hx, and changes were documented in the history of present illness. Otherwise, unchanged from my initial visit note.  Past Medical History:  Diagnosis Date  . Diabetes ([place])    no medication  . Fatty liver   . High cholesterol  no medication  . History of hiatal hernia   . History of kidney stones   . Hypothyroidism 2017   followed by Dr. Ignacia Felling in [place]  . Migraine    silent  . Nerve pain left elbow following IV  . Nerve pain right thigh  . PONV (postoperative nausea and vomiting)   . 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 Shingles 2018  . Shoulder pain, left   . Sleep apnea    Past Surgical History:  Procedure Laterality Date  . BREAST REDUCTION SURGERY Bilateral 1997  . CARPAL TUNNEL RELEASE    . CERVICAL  FUSION     [phone] and C6-7 2005  . COLONOSCOPY    . HEMORRHOID SURGERY  11/2014   thrombosed hemorroid opened in office  . VAGINAL HYSTERECTOMY  2000  . VULVECTOMY Left 03/14/2016   Procedure: LABIAL MASS EXCISION;  Surgeon: Megan Salon, MD;  Location: [facility];  Service: Gynecology;  Laterality: Left;  follow first case   Social History   Socioeconomic History  . Marital status: Married    Spouse name: Francee Piccolo  . Number of children: 1  . Years of education: 71  . Highest education level: Not on file  Occupational History  . housewife  Tobacco Use  . Smoking status: Never Smoker  . Smokeless tobacco: Never Used  Substance and Sexual Activity  . Alcohol use: No    Alcohol/week: 0.0 oz  . Drug use: No  . Sexual activity: Yes    Partners: Male    Birth control/protection: Surgical    Comment: TAH  Social History Narrative   Patient lives at home with her husband Francee Piccolo. Patient has one child and a high school education.    Patient is right-handed.  Patient drinks one cup of tea daily and occasionally a diet soda.   Current Outpatient Medications on File Prior to Visit  Medication Sig Dispense Refill  . Amylase-Lipase-Protease (PANCREASE PO) Take 3 tablets by mouth 3 (three) times daily with meals.    . Ascorbic Acid (VITAMIN C) 1000 MG tablet 1,000 mg daily.    Marland Kitchen b complex vitamins tablet Take 1 tablet by mouth daily.    [name] (BERBERINE COMPLEX PO) Take 500 mg by mouth 3 (three) times daily with meals.    . Cholecalciferol (VITAMIN D3) 5000 units CAPS Take 5,000 Units by mouth every evening.    . Choline Dihydrogen Citrate (CHOLINE CITRATE PO) Take by mouth.    Marland Kitchen ibuprofen (ADVIL,MOTRIN) 200 MG tablet Take 800 mg by mouth 3 (three) times daily as needed for headache or moderate pain.    . INOSITOL PO Take 1 capsule by mouth daily.    . Magnesium 400 MG TABS Take 400 mg by mouth daily.    . metFORMIN (GLUCOPHAGE) 1000 MG tablet Take 1 tablet (1,000  mg total) by mouth daily with supper. 90 tablet 3  . Omega-3 Fatty Acids (ULTRA OMEGA-3 FISH OIL PO) Take 1 capsule by mouth daily.    . TURMERIC PO Take by mouth daily.     No current facility-administered medications on file prior to visit.    Allergies  Allergen Reactions  . Eggs Or Egg-Derived Products     Upset GI  . Hydrocodone Nausea And Vomiting  . Metoclopramide     Anxiety  . Other Nausea And Vomiting    ENTEX, Ragweed  . Macrobid [Nitrofurantoin Monohyd Macro] Rash   Family History  Problem Relation Age of Onset  .  Heart Problems Mother   . High blood pressure Mother   . Diabetes Mother   . Dementia Mother   . Gout Father   . Diabetes Father   . Colon cancer Father    Pt has FH of DM in M, F, brothers.  PE: BP (!) 114/56 (BP Location: Right Arm, Patient Position: Sitting, Cuff Size: Large)   Pulse (!) 113   Temp 98.1 F (36.7 C) (Oral)   Ht 5' 3.25" (1.607 m)   Wt 252 lb 9.6 oz (114.6 kg)   LMP 01/03/1998   SpO2 97%   BMI 44.39 kg/m  Wt Readings from Last 3 Encounters:  09/08/17 252 lb 9.6 oz (114.6 kg)  05/11/17 259 lb 12.8 oz (117.8 kg)  03/09/17 257 lb (116.6 kg)   Constitutional: overweight, in NAD Eyes: PERRLA, EOMI, no exophthalmos ENT: moist mucous membranes, no thyromegaly, no cervical lymphadenopathy Cardiovascular: tachycardia, RR, No MRG Respiratory: CTA B Gastrointestinal: abdomen soft, NT, ND, BS+ Musculoskeletal: no deformities, strength intact in all 4 Skin: moist, warm, no rashes Neurological: no tremor with outstretched hands, DTR normal in all 4  ASSESSMENT: 1. DM2, non-insulin-dependent, becoming uncontrolled, without long term complications, but with hyperglycemia  2. HL  3.  Thyroid nodules  PLAN:  1. Patient with long-standing, previously diet-controlled diabetes, with worsening control at last visit due to relaxing her diet.  I gave her specific suggestions about improving diet.  I suggested a low-fat, whole food  plant-based diet and discussed about the need to reduce her insulin resistance to gain control of her diabetes.  We also added metformin at dinnertime to improve her a.m. sugars.  She tolerated this well in the past.  We started at the low dose and she is now taking the thousand milligrams daily with dinner. - At last visit, we discussed about starting metformin and I recommended that she started the thousand milligrams with dinner.  She did so, but she then increase it further to 1000 mg twice a day as her sugars were not quite at goal. - At this visit she has some diarrhea with metformin but this could also be due to magnesium, which she will try to reduce -Her sugars are almost all at goal.  We discussed about possibly reducing the metformin dose in a.m. to 500 mg and if sugars remain at goal, she can also reduce the evening dose to 500 mg - I suggested to:  Patient Instructions  Please change:  - Metformin 500 mg in am and 1000 mg with dinner  Continue to check sugars once a day.  Please return in 4 months with your sugar log.   - today, HbA1c is 5.7% (improved!) - continue checking sugars at different times of the day - check 1x a day, rotating checks - advised for yearly eye exams >> she is UTD - Return to clinic in 3 mo with sugar log   2. HL - Reviewed latest lipid panel from 01/2016: LDL higher than target, triglycerides slightly high, HDL slightly low Lab Results  Component Value Date   CHOL 223 (H) 01/07/2016   HDL 47 (L) 01/07/2016   LDLCALC 143 (H) 01/07/2016   TRIG 164 (H) 01/07/2016   CHOLHDL 4.7 01/07/2016  - She is not on statins due to previous muscle aches and weakness.  She is only on omega-3 fatty acids.  3.  Thyroid nodules -Reviewed together the report of her thyroid ultrasound from 2017.  The thyroid nodules appear very small and not  worrisome.  However, we had her fill out release of information form and I will try to get the CD with the images of her latest  ultrasound. -No neck compression symptoms except for dysphagia, which occurred after her neck surgery in which her esophagus was nicked. -Reviewed her most recent TFTs from 03/2017: TSH normal  Addendum: Received the results and CD images of her thyroid ultrasound from 03/31/2015 -her thyroid nodules are all subcentimeter and not worrisome.  No need to repeat a new ultrasound unless she develops neck compression symptoms.  Philemon Kingdom, MD PhD [facility] Endocrinology

## 2017-09-08 NOTE — Progress Notes (Signed)
Jade Fam, MD Dermatology/thx dmf

## 2017-09-08 NOTE — Patient Instructions (Addendum)
Please change:  - Metformin 500 mg in am and 1000 mg with dinner  Continue to check sugars once a day.  Please return in 4 months with your sugar log.

## 2017-09-11 ENCOUNTER — Telehealth: Payer: Self-pay | Admitting: Emergency Medicine

## 2017-09-11 MED ORDER — METFORMIN HCL 500 MG PO TABS: ORAL_TABLET | ORAL | 3 refills | Status: DC

## 2017-09-11 NOTE — Telephone Encounter (Signed)
Prescription had not yet been sent, sent it in and pharmacy is aware

## 2017-09-11 NOTE — Telephone Encounter (Signed)
[facility] called and asked that you give them a call back to verify the instructions and dosage on patients Metformin. Thanks.

## 2017-10-11 ENCOUNTER — Telehealth: Payer: Self-pay

## 2017-10-11 NOTE — Telephone Encounter (Signed)
Left message for patient to return our call at [phone].  

## 2017-10-11 NOTE — Telephone Encounter (Signed)
Notified patient of message from Dr. Gherghe, patient expressed understanding and agreement. No further questions.  

## 2017-10-11 NOTE — Telephone Encounter (Signed)
-----   Message from Philemon Kingdom, MD sent at 10/10/2017  5:21 PM EDT ----- Lenna Sciara, Can you please let her know that I received the CD with the images of her thyroid ultrasound from 2017 and she only has very small thyroid nodules, less than 1 cm, and not worrisome.  No intervention is needed. Ty, C

## 2017-10-26 HISTORY — PX: OTHER SURGICAL HISTORY: SHX169

## 2017-11-28 ENCOUNTER — Ambulatory Visit: Payer: BLUE CROSS/BLUE SHIELD | Admitting: Nurse Practitioner

## 2017-12-06 ENCOUNTER — Ambulatory Visit: Payer: BLUE CROSS/BLUE SHIELD | Admitting: Nurse Practitioner

## 2018-01-08 ENCOUNTER — Ambulatory Visit: Payer: BLUE CROSS/BLUE SHIELD | Admitting: Internal Medicine

## 2018-01-08 ENCOUNTER — Encounter: Payer: Self-pay | Admitting: Internal Medicine

## 2018-01-08 VITALS — BP 130/80 | HR 100 | Ht 63.25 in | Wt 250.0 lb

## 2018-01-08 DIAGNOSIS — E042 Nontoxic multinodular goiter: Secondary | ICD-10-CM

## 2018-01-08 DIAGNOSIS — E782 Mixed hyperlipidemia: Secondary | ICD-10-CM | POA: Diagnosis not present

## 2018-01-08 DIAGNOSIS — E1165 Type 2 diabetes mellitus with hyperglycemia: Secondary | ICD-10-CM | POA: Diagnosis not present

## 2018-01-08 LAB — POCT GLYCOSYLATED HEMOGLOBIN (HGB A1C): Hemoglobin A1C: 5.6 % (ref 4.0–5.6)

## 2018-01-08 LAB — LIPID PANEL
CHOL/HDL RATIO: 5
Cholesterol: 229 mg/dL — ABNORMAL HIGH (ref 0–200)
HDL: 42 mg/dL (ref >39.00)
LDL Cholesterol: 155 mg/dL — ABNORMAL HIGH (ref 0–99)
NonHDL: 187.48
TRIGLYCERIDES: 164 mg/dL — AB (ref 0.0–149.0)
VLDL: 32.8 mg/dL (ref 0.0–40.0)

## 2018-01-08 LAB — MICROALBUMIN / CREATININE URINE RATIO
Creatinine,U: 91.5 mg/dL
Microalb Creat Ratio: 0.8 mg/g (ref 0.0–30.0)

## 2018-01-08 LAB — TSH: TSH: 1.6 u[IU]/mL (ref 0.35–4.50)

## 2018-01-08 MED ORDER — METFORMIN HCL 500 MG PO TABS: ORAL_TABLET | ORAL | 3 refills | Status: DC

## 2018-01-08 NOTE — Progress Notes (Signed)
Patient ID: Jade Anderson, female   DOB: Apr 07, 1960, 58 y.o.   MRN: [id number]   HPI: Jade Anderson is a 58 y.o.-year-old female, initially referred by her PCP, Dr. Edwinna Areola, returning for follow-up for DM2, dx in 2010 non-insulin-dependent, without long term complications.  Last visit 4 months ago.  She had rotator cuff surgery on left shoulder 10/26/2017.  She is in physical therapy twice a week.  Last hemoglobin A1c was: Lab Results  Component Value Date   HGBA1C 5.7 (A) 09/08/2017   HGBA1C 6.6 (H) 03/09/2017   HGBA1C 6.2 (A) 02/23/2016   HGBA1C 6.0 (H) 12/25/2014   HGBA1C 5.7 (H) 12/19/2013  2010: HbA1c 9.4%  At last visit we started: - Metformin 1000 mg 2x a day >> in 09/2017: We changed to 500 mg in a.m. and 1000 mg with dinner due to diarrhea >> now 1000 mg 2x a day in last few weeks  - no diarrhea She was previously on metformin but came off after changing her diet as her HbA1c dropped to 4.9%.  We restarted metformin 03/2017.  Pt checks her sugars 0-1x a day: - am: 120-160 >> 112-139 >> 127-146, 161 - 2h after b'fast: n/c - before lunch: n/c>> 89-121 >> 92-128 - 2h after lunch: n/c >> 113 >> 128 - before dinner: n/c >> 94-130 >> 106-134, 160 - 2h after dinner: n/c >> 131 >> 109, 156 - bedtime: n/c >> 103-135 >> 114-131, 145, 231 (Sick) - nighttime: n/c Lowest sugar was 120 >> 89 >> 89; she has hypoglycemia awareness in the 70s. Highest sugar was 160 >> 137 >> 231.  She has a h/o pancreatitis as a child (2/2 mumps).  Glucometer: ReliOn  Pt's meals are: - Breakfast: Oatmeal, sausage, bacon, biscuits, hash brown, fruit, peanut butter  (allergic to eggs) - Lunch: Lunch meat, hamburger, hot dog, salads - Dinner: Organic beef or chicken, vegetables, salads, bread - Snacks: 3: Fruit, nuts + occasional fast food.  Lately TV dinners as she could not cope due to her shoulder surgery.  -No CKD, last BUN/creatinine:  Lab Results  Component Value Date   BUN 13  03/09/2017   BUN 13 02/23/2016   CREATININE 0.90 03/09/2017   CREATININE 0.8 02/23/2016   -+ HL; last set of lipids: Lab Results  Component Value Date   CHOL 223 (H) 01/07/2016   HDL 47 (L) 01/07/2016   LDLCALC 143 (H) 01/07/2016   TRIG 164 (H) 01/07/2016   CHOLHDL 4.7 01/07/2016  On omega-3 fatty acids.  He is intolerant to statins- tried Lipitor and developed muscle aches cramps..  She does not want to restart statins. - last eye exam was in spring 2019: No DR -No numbness and tingling in her feet.  She has a history of fatty liver.  She had hair loss >> TFTs normal but at ULN >> started LT3 >> worsening diabetes >> stopped this after 1 year.  Latest TSH was normal: Lab Results  Component Value Date   TSH 2.250 03/09/2017   She has a h/o thyroid nodules: 0.6-0.9 cm bilaterally per U/S 03/31/2015.  She had a repeat U/S 10/08/2015 ([place]).  Pt denies: - feeling nodules in neck - dysphagia - choking - SOB with lying down But she does have cough and hoarseness due to a URI.  ROS: Constitutional: no weight gain/no weight loss, no fatigue, no subjective hyperthermia, no subjective hypothermia Eyes: no blurry vision, no xerophthalmia ENT: no sore throat, + see HPI Cardiovascular:  no CP/no SOB/no palpitations/no leg swelling Respiratory: + Cough/no SOB/no wheezing Gastrointestinal: no N/no V/no D/no C/no acid reflux Musculoskeletal: + Muscle aches/+ joint aches Skin: no rashes, no hair loss Neurological: no tremors/no numbness/no tingling/no dizziness  I reviewed pt's medications, allergies, PMH, social hx, family hx, and changes were documented in the history of present illness. Otherwise, unchanged from my initial visit note.  Past Medical History:  Diagnosis Date  . Diabetes ([place])    no medication  . Fatty liver   . High cholesterol    no medication  . History of hiatal hernia   . History of kidney stones   . Hypothyroidism 2017    followed by Dr. Ignacia Felling in [place]  . Migraine    silent  . Nerve pain left elbow following IV  . Nerve pain right thigh  . PONV (postoperative nausea and vomiting)   . 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 Shingles 2018  . Shoulder pain, left   . Sleep apnea    Past Surgical History:  Procedure Laterality Date  . BREAST REDUCTION SURGERY Bilateral 1997  . CARPAL TUNNEL RELEASE    . CERVICAL FUSION     [phone] and C6-7 2005  . COLONOSCOPY    . HEMORRHOID SURGERY  11/2014   thrombosed hemorroid opened in office  . VAGINAL HYSTERECTOMY  2000  . VULVECTOMY Left 03/14/2016   Procedure: LABIAL MASS EXCISION;  Surgeon: Megan Salon, MD;  Location: [facility];  Service: Gynecology;  Laterality: Left;  follow first case   Social History   Socioeconomic History  . Marital status: Married    Spouse name: Francee Piccolo  . Number of children: 1  . Years of education: 31  . Highest education level: Not on file  Occupational History  . housewife  Tobacco Use  . Smoking status: Never Smoker  . Smokeless tobacco: Never Used  Substance and Sexual Activity  . Alcohol use: No    Alcohol/week: 0.0 oz  . Drug use: No  . Sexual activity: Yes    Partners: Male    Birth control/protection: Surgical    Comment: TAH  Social History Narrative   Patient lives at home with her husband Francee Piccolo. Patient has one child and a high school education.    Patient is right-handed.  Patient drinks one cup of tea daily and occasionally a diet soda.   Current Outpatient Medications on File Prior to Visit  Medication Sig Dispense Refill  . Amylase-Lipase-Protease (PANCREASE PO) Take 3 tablets by mouth 3 (three) times daily with meals.    . Ascorbic Acid (VITAMIN C) 1000 MG tablet 1,000 mg daily.    Marland Kitchen b complex vitamins tablet Take 1 tablet by mouth daily.    . Cholecalciferol (VITAMIN D3) 5000 units CAPS Take 5,000 Units by mouth every evening.    . Choline Dihydrogen Citrate (CHOLINE CITRATE PO) Take by  mouth.    . fluticasone (FLONASE) 50 MCG/ACT nasal spray Place into both nostrils daily.    Marland Kitchen ibuprofen (ADVIL,MOTRIN) 200 MG tablet Take 800 mg by mouth 3 (three) times daily as needed for headache or moderate pain.    . INOSITOL PO Take 1 capsule by mouth daily.    Marland Kitchen loratadine (CLARITIN) 10 MG tablet Take 10 mg by mouth daily.    . Magnesium 400 MG TABS Take 400 mg by mouth daily.    . metFORMIN (GLUCOPHAGE) 500 MG tablet Take 500 mg in am and 1000 mg at night 270  tablet 3  . Omega-3 Fatty Acids (ULTRA OMEGA-3 FISH OIL PO) Take 1 capsule by mouth daily.    . pseudoephedrine (SUDAFED) 30 MG tablet Take 30 mg by mouth every 4 (four) hours as needed.    . TURMERIC PO Take by mouth daily.     No current facility-administered medications on file prior to visit.    Allergies  Allergen Reactions  . Eggs Or Egg-Derived Products     Upset GI  . Hydrocodone Nausea And Vomiting  . Metoclopramide     Anxiety  . Other Nausea And Vomiting    ENTEX, Ragweed  . Macrobid [Nitrofurantoin Monohyd Macro] Rash   Family History  Problem Relation Age of Onset  . Heart Problems Mother   . High blood pressure Mother   . Diabetes Mother   . Dementia Mother   . Gout Father   . Diabetes Father   . Colon cancer Father    Pt has FH of DM in M, F, brothers.  PE: BP 130/80   Pulse 100   Ht 5' 3.25" (1.607 m) Comment: measured  Wt 250 lb (113.4 kg)   LMP 01/03/1998   SpO2 98%   BMI 43.94 kg/m  Wt Readings from Last 3 Encounters:  01/08/18 250 lb (113.4 kg)  09/08/17 252 lb 9.6 oz (114.6 kg)  05/11/17 259 lb 12.8 oz (117.8 kg)   Constitutional: overweight, in NAD Eyes: PERRLA, EOMI, no exophthalmos ENT: moist mucous membranes, no thyromegaly, no cervical lymphadenopathy Cardiovascular: Tachycardia, RR, No MRG Respiratory: CTA B Gastrointestinal: abdomen soft, NT, ND, BS+ Musculoskeletal: no deformities, strength intact in all 4 Skin: moist, warm, no rashes Neurological: no tremor with  outstretched hands, DTR normal in all 4  ASSESSMENT: 1. DM2, non-insulin-dependent, becoming uncontrolled, without long term complications, but with hyperglycemia  2. HL  3.  Thyroid nodules  PLAN:  1. Patient with longstanding, previously diet-controlled diabetes, with worsening control afterwards due to relaxing her diet.  We restarted metformin but at last visit we decreased the dose due to mild diarrhea.  Since then, sugars worsened due to URI and her previous shoulder surgery so she went back to the 1000 mg twice a day dose, which she now tolerates without diarrhea. -Sugars are higher in the morning compared to last visit, but otherwise at goal -No need to change the regimen now, but I advised her to decrease the dose of metformin if the sugars improve after she starts being more mobile - I suggested to:  Patient Instructions  Please continue: - Metformin 1000 mg in am and 1000 mg with dinner  Continue to check sugars once a day.  Please return in 6 months with your sugar log.   - today, HbA1c is 5.6% (even lower) - continue checking sugars at different times of the day - check 1x a day, rotating checks - advised for yearly eye exams >> she is UTD - will check annual labs - stopped at [employer] this am, so not fasting - no flu shot - refuses - Return to clinic in 6 mo with sugar log    2. HL - Reviewed latest lipid panel from 01/2016: LDL higher than target, triglycerides slightly high, HDL slightly low Lab Results  Component Value Date   CHOL 223 (H) 01/07/2016   HDL 47 (L) 01/07/2016   LDLCALC 143 (H) 01/07/2016   TRIG 164 (H) 01/07/2016   CHOLHDL 4.7 01/07/2016  -She is not on statins due to previous muscle aches and weakness.  She is on omega-3 fatty acids. - check lipids today  3.  Thyroid nodules -Reviewed together the report of her thyroid ultrasound from 03/31/2015: Thyroid nodules are all subcentimeter and not worrisome.  I reviewed the CD after her last  visit. -No neck compression symptoms -I would not suggest a repeat ultrasound unless she develops neck compression symptoms. -Most recent TFTs were from 03/2017: TSH normal - repeat TSH now  Component     Latest Ref Rng & Units 01/08/2018  Glucose     65 - 99 mg/dL 143 (H)  BUN     7 - 25 mg/dL 11  Creatinine     0.50 - 1.05 mg/dL 0.78  GFR, Est Non African American     > OR = 60 mL/min/1.28m2 84  GFR, Est African American     > OR = 60 mL/min/1.57m2 98  BUN/Creatinine Ratio     6 - 22 (calc) NOT APPLICABLE  Sodium     468 - 146 mmol/L 140  Potassium     3.5 - 5.3 mmol/L 4.5  Chloride     98 - 110 mmol/L 103  CO2     20 - 32 mmol/L 30  Calcium     8.6 - 10.4 mg/dL 9.5  Total Protein     6.1 - 8.1 g/dL 6.3  Albumin MSPROF     3.6 - 5.1 g/dL 4.0  Globulin     1.9 - 3.7 g/dL (calc) 2.3  AG Ratio     1.0 - 2.5 (calc) 1.7  Total Bilirubin     0.2 - 1.2 mg/dL 0.4  Alkaline phosphatase (APISO)     33 - 130 U/L 75  AST     10 - 35 U/L 27  ALT     6 - 29 U/L 43 (H)  Cholesterol     0 - 200 mg/dL 229 (H)  Triglycerides     0.0 - 149.0 mg/dL 164.0 (H)  HDL Cholesterol     >39.00 mg/dL 42.00  VLDL     0.0 - 40.0 mg/dL 32.8  LDL (calc)     0 - 99 mg/dL 155 (H)  Total CHOL/HDL Ratio      5  NonHDL      187.48  Microalb, Ur     0.0 - 1.9 mg/dL <0.7  Creatinine,U     mg/dL 91.5  MICROALB/CREAT RATIO     0.0 - 30.0 mg/g 0.8  Hemoglobin A1C     4.0 - 5.6 % 5.6  TSH     0.35 - 4.50 uIU/mL 1.60   TSH normal. ACR normal. LDL higher than last year >> definitely needs to change diet >> will recommend a diet lower in fatty foods, more fruits and veggies. ALT high. Likely 2/2 fatty liver. Glu slightly high.  Philemon Kingdom, MD PhD [facility] Endocrinology

## 2018-01-08 NOTE — Patient Instructions (Addendum)
Please continue: - Metformin 1000 mg in am and 1000 mg with dinner  Continue to check sugars once a day.  Please return in 6 months with your sugar log.

## 2018-01-09 ENCOUNTER — Telehealth: Payer: Self-pay

## 2018-01-09 LAB — COMPLETE METABOLIC PANEL WITH GFR
AG Ratio: 1.7 (calc) (ref 1.0–2.5)
ALT: 43 U/L — AB (ref 6–29)
AST: 27 U/L (ref 10–35)
Albumin: 4 g/dL (ref 3.6–5.1)
Alkaline phosphatase (APISO): 75 U/L (ref 33–130)
BILIRUBIN TOTAL: 0.4 mg/dL (ref 0.2–1.2)
BUN: 11 mg/dL (ref 7–25)
CHLORIDE: 103 mmol/L (ref 98–110)
CO2: 30 mmol/L (ref 20–32)
Calcium: 9.5 mg/dL (ref 8.6–10.4)
Creat: 0.78 mg/dL (ref 0.50–1.05)
GFR, EST AFRICAN AMERICAN: 98 mL/min/{1.73_m2} (ref >=60)
GFR, Est Non African American: 84 mL/min/{1.73_m2} (ref >=60)
GLUCOSE: 143 mg/dL — AB (ref 65–99)
Globulin: 2.3 g/dL (calc) (ref 1.9–3.7)
Potassium: 4.5 mmol/L (ref 3.5–5.3)
Sodium: 140 mmol/L (ref 135–146)
Total Protein: 6.3 g/dL (ref 6.1–8.1)

## 2018-01-09 NOTE — Telephone Encounter (Signed)
Gave patient results and she stated an understanding  

## 2018-01-09 NOTE — Telephone Encounter (Signed)
-----   Message from Philemon Kingdom, MD sent at 01/09/2018 12:55 PM EST ----- Lenna Sciara, can you please call pt:  Thyroid test normal. Urine proteins  normal. LDL higher than last year >> definitely needs to change diet >> please recommend a diet lower in fatty foods, more fruits and veggies. She could not tolerate statins before. ALT high. Likely 2/2 fatty liver. Glu slightly high.

## 2018-01-09 NOTE — Telephone Encounter (Signed)
LMTCB

## 2018-02-20 NOTE — Progress Notes (Signed)
[facility]  PATIENT: Jade Anderson DOB: Apr 16, 1960   REASON FOR VISIT: Follow-up for bilateral tinnitus, visual disturbance, headache HISTORY FROM: Patient    HISTORY OF PRESENT ILLNESS:UPDATE 2/19/2020CM Jade Anderson, 58 year old female returns for follow-up with history of bilateral tinnitus visual disturbance and headache.  She has been doing well over the last year.  Her tinnitus and flashing lights in her peripheral vision are very rare episodes now.  She is never wanted to be on medication for this.  These episodes occur less than once a month.  She denies loss of vision.  She does have a history of migraine but these are under control.  She has a history of obstructive sleep apnea uses CPAP.  She also has a history of Barrett's esophagitis.  She had rotator cuff surgery on the left in October of last year.  She has finished her rehab and doing well.  Her mom died in 12/18/22 from dementia and stroke.  She returns for reevaluation without further neurologic symptoms    UPDATE 11/21/16 CMMs [name] , 58 year old returns for yearly followup.She is only having rare episodes of tinnitus and seeing flashing lights in the peripheral vision. These episodes are rare and she does not wish to be on medication. The episodes occur once a month or less.  She denies any blurred vision loss of vision .She also has a history of migraine but these are under control. She continues to be evaluated at [facility] for her cervical spine.  She has had 2 spinal fusions in the past.  She is seen at the [facility] for Barretts esophagus. She returns for reevaluation.She has no new complaints.   HISTORY: She continues to have mild episodes of right tinnitus, seeing flashlight in the peripheral visual field. MRI of the brain done after her last visit was normal. She has a history of migraine headaches but these sensations are different.She is not have daily headache. 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 She's also had 2 cervical spine fusions and sees Dr. Owens Shark at [facility] most recently 4 weeks ago. He wants to do another fusion but she is holding off.    REVIEW OF SYSTEMS: Full 14 system review of systems performed and notable only for those listed, all others are neg:  Constitutional: neg  Cardiovascular: neg Ear/Nose/Throat: neg  Skin: neg Eyes: Occasional visual disturbance Respiratory: neg Gastroitestinal: Constipation Hematology/Lymphatic: neg  Endocrine: neg Musculoskeletal: Joint pain Allergy/Immunology: Environmental allergies food allergies Neurological: neg Psychiatric: neg Sleep : Obstructive sleep apnea with CPAP followed at [facility]    ALLERGIES: Allergies  Allergen Reactions  . Eggs Or Egg-Derived Products     Upset GI  . Hydrocodone Nausea And Vomiting  . Metoclopramide     Anxiety  . Other Nausea And Vomiting    ENTEX, Ragweed  . Macrobid [Nitrofurantoin Monohyd Macro] Rash    HOME MEDICATIONS: Outpatient Medications Prior to Visit  Medication Sig Dispense Refill  . Amylase-Lipase-Protease (PANCREASE PO) Take 3 tablets by mouth 3 (three) times daily with meals.    . Ascorbic Acid (VITAMIN C) 1000 MG tablet 1,000 mg daily.    Marland Kitchen b complex vitamins tablet Take 1 tablet by mouth daily.    . Cholecalciferol (VITAMIN D3) 5000 units CAPS Take 5,000 Units by mouth every evening.    . Choline Dihydrogen Citrate (CHOLINE CITRATE PO) Take by mouth.    Marland Kitchen ibuprofen (ADVIL,MOTRIN) 200  MG tablet Take 800 mg by mouth 3 (three) times daily as needed for headache or moderate pain.    . INOSITOL PO Take 1 capsule by mouth daily.    Marland Kitchen loratadine (CLARITIN) 10 MG tablet Take 10 mg by mouth daily.    . Magnesium 400 MG TABS Take 400 mg by mouth daily.    . metFORMIN (GLUCOPHAGE) 500 MG tablet Take 1000 mg in am and 1000 mg at night 360 tablet 3  . TURMERIC PO Take by mouth daily.    Marland Kitchen UNABLE TO FIND  Med Name:  CBD hemp 60ng oil, phytocannbinoids 30mg  as needed    . fluticasone (FLONASE) 50 MCG/ACT nasal spray Place into both nostrils daily.    . Omega-3 Fatty Acids (ULTRA OMEGA-3 FISH OIL PO) Take 1 capsule by mouth daily.    . pseudoephedrine (SUDAFED) 30 MG tablet Take 30 mg by mouth every 4 (four) hours as needed.     No facility-administered medications prior to visit.     PAST MEDICAL HISTORY: Past Medical History:  Diagnosis Date  . Diabetes ([place])    no medication  . Fatty liver   . High cholesterol    no medication  . History of hiatal hernia   . History of kidney stones   . Hypothyroidism 2017   followed by Dr. Ignacia Felling in [place]  . Migraine    silent  . Nerve pain left elbow following IV  . Nerve pain right thigh  . PONV (postoperative nausea and vomiting)   . 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 Shingles 2018  . Shoulder pain, left   . Sleep apnea     PAST SURGICAL HISTORY: Past Surgical History:  Procedure Laterality Date  . BREAST REDUCTION SURGERY Bilateral 1997  . CARPAL TUNNEL RELEASE    . CERVICAL FUSION     [phone] and C6-7 2005  . COLONOSCOPY    . HEMORRHOID SURGERY  11/2014   thrombosed hemorroid opened in office  . shouder surgery Left 10/26/2017   rotator cuff  . VAGINAL HYSTERECTOMY  2000  . VULVECTOMY Left 03/14/2016   Procedure: LABIAL MASS EXCISION;  Surgeon: Megan Salon, MD;  Location: [facility];  Service: Gynecology;  Laterality: Left;  follow first case    FAMILY HISTORY: Family History  Problem Relation Age of Onset  . Heart Problems Mother   . High blood pressure Mother   . Diabetes Mother   . Dementia Mother   . Gout Father   . Diabetes Father   . Colon cancer Father     SOCIAL HISTORY: Social History   Socioeconomic History  . Marital status: Married    Spouse name: Francee Piccolo  . Number of children: 1  . Years of education: 44  . Highest education level: Not on file  Occupational History  . Not on file    Social Needs  . Financial resource strain: Not on file  . Food insecurity:    Worry: Not on file    Inability: Not on file  . Transportation needs:    Medical: Not on file    Non-medical: Not on file  Tobacco Use  . Smoking status: Never Smoker  . Smokeless tobacco: Never Used  Substance and Sexual Activity  . Alcohol use: No    Alcohol/week: 0.0 standard drinks  . Drug use: No  . Sexual activity: Yes    Partners: Male    Birth control/protection: Surgical    Comment: TAH  Lifestyle  . Physical activity:    Days per week: Not on file    Minutes per session: Not on file  . Stress: Not on file  Relationships  . Social connections:    Talks on phone: Not on file    Gets together: Not on file    Attends religious service: Not on file    Active member of club or organization: Not on file    Attends meetings of clubs or organizations: Not on file    Relationship status: Not on file  . Intimate partner violence:    Fear of current or ex partner: Not on file    Emotionally abused: Not on file    Physically abused: Not on file    Forced sexual activity: Not on file  Other Topics Concern  . Not on file  Social History Narrative   Patient lives at home with her husband Francee Piccolo. Patient has one child and a high school education.    Patient is right-handed.  Patient drinks one cup of tea daily and occasionally a diet soda.     PHYSICAL EXAM  Vitals:   02/21/18 0815  BP: (!) 152/83  Pulse: 88  Weight: 245 lb 12.8 oz (111.5 kg)  Height: 5' 3.25" (1.607 m)   Body mass index is 43.2 kg/m. Generalized: Well developed, obese female in no acute distress, Neurological examination   Mentation: Alert oriented to time, place, history taking. Follows all commands speech and language fluent  Cranial nerve II-XII: Visual acuity 20/30 bil. Pupils were equal round reactive to light extraocular movements were full, visual field were full on confrontational test. Facial sensation and  strength were normal. hearing was intact to finger rubbing bilaterally. Uvula tongue midline. head turning and shoulder shrug and were normal and symmetric.Tongue protrusion into cheek strength was normal. Motor: normal bulk and tone, full strength in the BUE, BLE, fine finger movements normal,   Coordination: finger-nose-finger, heel-to-shin bilaterally, no dysmetria Reflexes: Symmetric upper and lower,  plantar responses were flexor bilaterally. Gait and Station: Rising up from seated position without assistance, normal stance, moderate stride, good arm swing, smooth turning, able to perform tiptoe, and heel walking without difficulty. Tandem gait is steady   DIAGNOSTIC DATA (LABS, IMAGING, TESTING) -  ASSESSMENT AND PLAN  58 y.o. year old female   has a past medical history of Diabetes; High cholesterol; Sleep apnea; Migraine; and tinnitus here to follow up. Her episodes of tinnitus are rare. She is not on any preventive medication;  here to follow-up.  Episodes of flashing light continue to be intermittent to rare Follow-up  Prn only [name], [facility], [facility], [facility] [address], [place], [place] [address] [phone]

## 2018-02-21 ENCOUNTER — Ambulatory Visit: Payer: BLUE CROSS/BLUE SHIELD | Admitting: Nurse Practitioner

## 2018-02-21 ENCOUNTER — Encounter: Payer: Self-pay | Admitting: Nurse Practitioner

## 2018-02-21 VITALS — BP 152/83 | HR 88 | Ht 63.25 in | Wt 245.8 lb

## 2018-02-21 DIAGNOSIS — H9313 Tinnitus, bilateral: Secondary | ICD-10-CM

## 2018-02-21 DIAGNOSIS — H539 Unspecified visual disturbance: Secondary | ICD-10-CM

## 2018-02-21 NOTE — Patient Instructions (Signed)
Episodes of flashing light continue to be intermittent to rare Follow-up  Prn only

## 2018-02-21 NOTE — Progress Notes (Signed)
I have reviewed and agreed above plan. 

## 2018-03-01 ENCOUNTER — Other Ambulatory Visit: Payer: Self-pay | Admitting: *Deleted

## 2018-03-01 DIAGNOSIS — N83201 Unspecified ovarian cyst, right side: Secondary | ICD-10-CM

## 2018-03-02 ENCOUNTER — Telehealth: Payer: Self-pay | Admitting: Obstetrics & Gynecology

## 2018-03-02 NOTE — Telephone Encounter (Signed)
Call placed to convey benefits for ultrasound. °

## 2018-03-06 NOTE — Telephone Encounter (Signed)
Call placed to patient to provide benefits for scheduled appointment. Patient answered the phone but refused to provide date of birth, stating she never provides personal information over the phone unless she initiates the call. Patient advises she will call back to our office.  Therefore benefits were not provided.   cc: Thayer Ohm  cc: Lamont Snowball, RN

## 2018-03-06 NOTE — Telephone Encounter (Signed)
Call placed to patient to review benefits for scheduled ultrasound appointment. Left voicemail message requesting a return call °

## 2018-03-06 NOTE — Telephone Encounter (Signed)
Patient returned call. Spoke with patient regarding benefit for scheduled ultrasound with annual exam. Patient is concerned as to why she will need to pay anything. Explained this is the benefit for the ultrasound portion of the appointment. After much conversation, patient reluctantly acknowledges understanding. Patient scheduled 03/15/2018 with Dr Sabra Heck. Patient aware of appointment date, arrival time and cancellation. Will close encounter   cc: Thayer Ohm  cc: Lamont Snowball, RN

## 2018-03-15 ENCOUNTER — Ambulatory Visit (INDEPENDENT_AMBULATORY_CARE_PROVIDER_SITE_OTHER): Payer: BLUE CROSS/BLUE SHIELD | Admitting: Obstetrics & Gynecology

## 2018-03-15 ENCOUNTER — Other Ambulatory Visit: Payer: Self-pay

## 2018-03-15 ENCOUNTER — Ambulatory Visit (INDEPENDENT_AMBULATORY_CARE_PROVIDER_SITE_OTHER): Payer: BLUE CROSS/BLUE SHIELD

## 2018-03-15 ENCOUNTER — Encounter: Payer: Self-pay | Admitting: Obstetrics & Gynecology

## 2018-03-15 DIAGNOSIS — Z01419 Encounter for gynecological examination (general) (routine) without abnormal findings: Secondary | ICD-10-CM

## 2018-03-15 DIAGNOSIS — E559 Vitamin D deficiency, unspecified: Secondary | ICD-10-CM | POA: Diagnosis not present

## 2018-03-15 DIAGNOSIS — N83201 Unspecified ovarian cyst, right side: Secondary | ICD-10-CM

## 2018-03-15 DIAGNOSIS — Z79899 Other long term (current) drug therapy: Secondary | ICD-10-CM

## 2018-03-15 DIAGNOSIS — N83299 Other ovarian cyst, unspecified side: Secondary | ICD-10-CM

## 2018-03-15 NOTE — Progress Notes (Signed)
58 y.o. G1P1 Married White or Caucasian female here for annual exam.  Had right rotator cuff tear and repair done 10/26/2017.  Doing well from surgery.  No pain but still can't reach back and close her bra in the back.  Mother passed in November.    Denies vaginal bleeding.  Son is still in [place].  Grandchildren are doing well.  Patient's last menstrual period was 01/03/1998.          Sexually active: Yes.    The current method of family planning is status post hysterectomy.    Exercising: No.  PT Smoker:  no  Health Maintenance: Pap:  12/25/14 neg  History of abnormal Pap:  yes MMG: 01/30/17 BIRADS1:neg.  Hasn't done due to rotator cuff repair.   Colonoscopy:  07/15/15 f/u 5 years  BMD:   2012 TDaP:  2013 Pneumonia vaccine(s):  n/a Shingrix:   No.  Not interested in this at this time. Hep C testing: 12/25/14 neg  Screening Labs: Vit D?   reports that she has never smoked. She has never used smokeless tobacco. She reports that she does not drink alcohol or use drugs.  Past Medical History:  Diagnosis Date  . Diabetes ([place])    no medication  . Fatty liver   . High cholesterol    no medication  . History of hiatal hernia   . History of kidney stones   . Hypothyroidism 2017   followed by Dr. Ignacia Felling in [place]  . Migraine    silent  . Nerve pain left elbow following IV  . Nerve pain right thigh  . PONV (postoperative nausea and vomiting)   . 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 Shingles 2018  . Shoulder pain, left   . Sleep apnea     Past Surgical History:  Procedure Laterality Date  . BREAST REDUCTION SURGERY Bilateral 1997  . CARPAL TUNNEL RELEASE    . CERVICAL FUSION     [phone] and C6-7 2005  . COLONOSCOPY    . HEMORRHOID SURGERY  11/2014   thrombosed hemorroid opened in office  . shouder surgery Left 10/26/2017   rotator cuff  . VAGINAL HYSTERECTOMY  2000  . VULVECTOMY Left 03/14/2016   Procedure: LABIAL MASS EXCISION;  Surgeon: Megan Salon,  MD;  Location: [facility];  Service: Gynecology;  Laterality: Left;  follow first case    Current Outpatient Medications  Medication Sig Dispense Refill  . Amylase-Lipase-Protease (PANCREASE PO) Take 3 tablets by mouth 3 (three) times daily with meals.    . Ascorbic Acid (VITAMIN C) 1000 MG tablet 1,000 mg daily.    [name] (BERBERINE COMPLEX PO) Take by mouth daily.    . Cholecalciferol (VITAMIN D3) 5000 units CAPS Take 5,000 Units by mouth every evening.    . Choline Dihydrogen Citrate (CHOLINE CITRATE PO) Take by mouth.    Marland Kitchen ibuprofen (ADVIL,MOTRIN) 200 MG tablet Take 800 mg by mouth 3 (three) times daily as needed for headache or moderate pain.    . INOSITOL PO Take 1 capsule by mouth daily.    . Magnesium 400 MG TABS Take 400 mg by mouth daily.    . metFORMIN (GLUCOPHAGE) 500 MG tablet Take 1,000 mg by mouth 2 (two) times daily.    . Omega-3 1000 MG CAPS Take by mouth.    . TURMERIC PO Take by mouth daily.    Marland Kitchen UNABLE TO FIND Med Name:  CBD hemp 60ng oil, phytocannbinoids 30mg  as  needed    . vitamin B-12 (CYANOCOBALAMIN) 1000 MCG tablet Take by mouth daily.     No current facility-administered medications for this visit.     Family History  Problem Relation Age of Onset  . Heart Problems Mother   . High blood pressure Mother   . Diabetes Mother   . Dementia Mother   . Gout Father   . Diabetes Father   . Colon cancer Father     Review of Systems  All other systems reviewed and are negative.   Exam:   BP 140/80 (BP Location: Right Arm, Patient Position: Sitting, Cuff Size: Large)   Pulse 100   Resp 16   Ht 5\' 3"  (1.6 m)   Wt 244 lb (110.7 kg)   LMP 01/03/1998   BMI 43.22 kg/m    Height: 5\' 3"  (160 cm)  Ht Readings from Last 3 Encounters:  03/15/18 5\' 3"  (1.6 m)  02/21/18 5' 3.25" (1.607 m)  01/08/18 5' 3.25" (1.607 m)    General appearance: alert, cooperative and appears stated age Head: Normocephalic, without obvious abnormality,  atraumatic Neck: no adenopathy, supple, symmetrical, trachea midline and thyroid normal to inspection and palpation Lungs: clear to auscultation bilaterally Breasts: normal appearance, no masses or tenderness Heart: regular rate and rhythm Abdomen: soft, non-tender; bowel sounds normal; no masses,  no organomegaly Extremities: extremities normal, atraumatic, no cyanosis or edema Skin: Skin color, texture, turgor normal. No rashes or lesions Lymph nodes: Cervical, supraclavicular, and axillary nodes normal. No abnormal inguinal nodes palpated Neurologic: Grossly normal   Pelvic: External genitalia:  no lesions              Urethra:  normal appearing urethra with no masses, tenderness or lesions              Bartholins and Skenes: normal                 Vagina: normal appearing vagina with normal color and discharge, no lesions              Cervix: absent              Pap taken: No. Bimanual Exam:  Uterus:  uterus absent              Adnexa: no mass, fullness, tenderness               Rectovaginal: Confirms               Anus:  normal sphincter tone, no lesions  Chaperone was present for exam.  Ultrasound today Uterus surgically absent Left ovary:  2.0 x 1.0 x 1.6cm Right ovary:  2.8 x 2.2 x 1.7cm with 2.4 x 1.7 x 1.8cm cyst, avascular, thick-walled with internal debris that is stable in size and 1.0 x 72mm thick-wall avascular cyst that is smaller in size Cul de sac:  No free fluid  Images compared with prior images and shown to patient.    A:  Well Woman with normal exam PMP, no HRT Type 2 DM Obesity H/O multiple neck surgeries H/O rotator cuff repair H/O complex right ovarian cyst that has been stable or gradually decreased in size over last five years.    P:   Mammogram guidelines reviewed.  Recommended yearly MMGs at this time pap smear not indicated due to prior hysterectomy B12 and Vit D obtained today D/w pt stopping PUS.  She would like to continue but comfortable  with every other year. Lab  work done with Dr. Cruzita Lederer return annually or prn

## 2018-03-16 LAB — VITAMIN B12: Vitamin B-12: >2000 pg/mL — ABNORMAL HIGH (ref 232–1245)

## 2018-03-16 LAB — VITAMIN D 25 HYDROXY (VIT D DEFICIENCY, FRACTURES): Vit D, 25-Hydroxy: 49.6 ng/mL (ref 30.0–100.0)

## 2018-03-19 ENCOUNTER — Telehealth: Payer: Self-pay | Admitting: Obstetrics & Gynecology

## 2018-03-19 NOTE — Telephone Encounter (Signed)
Patient would like the numbers for her vitamin D and B-12.

## 2018-03-19 NOTE — Telephone Encounter (Signed)
Spoke with patient, advised of 03/15/18 results per Dr. Sabra Heck. Questions answered, patient verbalizes understanding and is agreeable.   Notes recorded by Megan Salon, MD on 03/17/2018 at 12:11 AM EDT Pleaes let pt know her Vit D and B12 are not low. In fact, B12 is very high. I've seen this with other patients in the past and extensive researched if this level is too high and have found no evidence of issues with high B12. So, ok to continue on current regimen.  Encounter closed.

## 2018-05-21 ENCOUNTER — Telehealth: Payer: Self-pay | Admitting: Internal Medicine

## 2018-05-21 NOTE — Telephone Encounter (Signed)
Let's do a virtual appt and discuss options, we can move the July appt sooner

## 2018-05-21 NOTE — Telephone Encounter (Signed)
Patient called to let Dr Cruzita Lederer know that she has stopped taking Metformin because the generic that she got had some sort of fillers in it that gave her right flank pain with nausea   Would like to know how to proceed?

## 2018-05-22 ENCOUNTER — Encounter: Payer: Self-pay | Admitting: Internal Medicine

## 2018-05-22 ENCOUNTER — Ambulatory Visit (INDEPENDENT_AMBULATORY_CARE_PROVIDER_SITE_OTHER): Payer: BLUE CROSS/BLUE SHIELD | Admitting: Internal Medicine

## 2018-05-22 DIAGNOSIS — E1165 Type 2 diabetes mellitus with hyperglycemia: Secondary | ICD-10-CM | POA: Diagnosis not present

## 2018-05-22 DIAGNOSIS — E782 Mixed hyperlipidemia: Secondary | ICD-10-CM | POA: Diagnosis not present

## 2018-05-22 DIAGNOSIS — E042 Nontoxic multinodular goiter: Secondary | ICD-10-CM

## 2018-05-22 MED ORDER — METFORMIN HCL ER 500 MG PO TB24: 500.0000 mg | ORAL_TABLET | Freq: Two times a day (BID) | ORAL | 5 refills | Status: DC

## 2018-05-22 NOTE — Patient Instructions (Signed)
Please start: - Metformin ER 500 mg with dinner for few days, then increase to 500 mg 2x a day with meals.  Continue to check sugars once a day.  Please return in 4 months with your sugar log

## 2018-05-22 NOTE — Progress Notes (Signed)
Patient ID: Joeline Freer, female   DOB: January 18, 1960, 58 y.o.   MRN: [id number]   Patient location: Home My location: Office  Referring Provider: Patient, No Pcp Per  I connected with the patient on 05/22/18 at 10:56 am AM EDT by telephone and verified that I am speaking with the correct person.   I discussed the limitations of evaluation and management by telephone and the availability of in person appointments. The patient expressed understanding and agreed to proceed.   Details of the encounter are shown below.  HPI: Tarica Harl is a 58 y.o.-year-old female, presenting for follow-up for DM2, dx in 2010 non-insulin-dependent, without long term complications.  Last visit 4 months ago.    Last hemoglobin A1c was: Lab Results  Component Value Date   HGBA1C 5.6 01/08/2018   HGBA1C 5.7 (A) 09/08/2017   HGBA1C 6.6 (H) 03/09/2017   HGBA1C 6.2 (A) 02/23/2016   HGBA1C 6.0 (H) 12/25/2014   HGBA1C 5.7 (H) 12/19/2013  2010: HbA1c 9.4%  She is on: - Metformin 1000 mg 2x a day but developed diarrhea >> stopped 2 weeks ago - started chromium She was previously on metformin but came off after changing her diet as her HbA1c dropped to 4.9%.  We restarted metformin 03/2017.  Pt checks her sugars 1x every 2 days:  - am: 120-160 >> 112-139 >> 127-146, 161 >> 114-133, 157 - 2h after b'fast: n/c - before lunch: n/c>> 89-121 >> 92-128 >> 89-100 - 2h after lunch: n/c >> 113 >> 128 >> 109-150 - before dinner: n/c >> 94-130 >> 106-134, 160 >> on Metf.: 96, off metf: 140, 164 - 2h after dinner: n/c >> 131 >> 109, 156 >> off Metf.: 160 - bedtime: n/c >> 103-135 >> 114-131, 145, 231 (Sick) >> n/c - nighttime: n/c Lowest sugar was 89 >> 89 >> 89; she has hypoglycemia awareness in the 70s. Highest sugar was 137 >> 231 >> 206.  She has a history of pancreatitis as a child, due to mumps.  Glucometer: ReliOn  Pt's meals are: - Breakfast: Oatmeal, sausage, bacon, biscuits, hash brown, fruit, peanut  butter  (allergic to eggs) - Lunch: Lunch meat, hamburger, hot dog, salads - Dinner: Organic beef or chicken, vegetables, salads, bread - Snacks: 3: Fruit, nuts Eats fast foods.  -No CKD, last BUN/creatinine:  Lab Results  Component Value Date   BUN 11 01/08/2018   BUN 13 03/09/2017   CREATININE 0.78 01/08/2018   CREATININE 0.90 03/09/2017   -+ HL; last set of lipids: Lab Results  Component Value Date   CHOL 229 (H) 01/08/2018   HDL 42.00 01/08/2018   LDLCALC 155 (H) 01/08/2018   TRIG 164.0 (H) 01/08/2018   CHOLHDL 5 01/08/2018  On omega-3 fatty acids.  He is intolerant to statins- tried Lipitor and developed muscle aches cramps.  She would not want to restart statins. - last eye exam was in spring 2019: No DR. on berberine. -No numbness and tingling in her feet.  She has a history of fatty liver.  She had hair loss >> TFTs normal but at ULN >> started LT3 >> worsening diabetes >> stopped after 1 year.  Latest TSH was normal at last visit: Lab Results  Component Value Date   TSH 1.60 01/08/2018   She has a h/o thyroid nodules: 0.6 x 0.9 cm bilaterally, per U/S 03/31/2015.  She had a repeat U/S 10/08/2015 ([place]).  Pt denies: - feeling nodules in neck - hoarseness - dysphagia -  choking - SOB with lying down  ROS: Constitutional: no weight gain/+ weight loss, no fatigue, no subjective hyperthermia, no subjective hypothermia Eyes: no blurry vision, no xerophthalmia ENT: no sore throat, + see HPI Cardiovascular: no CP/no SOB/no palpitations/no leg swelling Respiratory: no cough/no SOB/no wheezing Gastrointestinal: no N/no V/no D/no C/no acid reflux Musculoskeletal: no muscle aches/no joint aches  Skin: no rashes, no hair loss Neurological: no tremors/no numbness/no tingling/no dizziness  I reviewed pt's medications, allergies, PMH, social hx, family hx, and changes were documented in the history of present illness. Otherwise,  unchanged from my initial visit note.  Past Medical History:  Diagnosis Date  . Diabetes ([place])    no medication  . Fatty liver   . High cholesterol    no medication  . History of hiatal hernia   . History of kidney stones   . Hypothyroidism 2017   followed by Dr. Ignacia Felling in [place]  . Migraine    silent  . Nerve pain left elbow following IV  . Nerve pain right thigh  . PONV (postoperative nausea and vomiting)   . 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 Shingles 2018  . Shoulder pain, left   . Sleep apnea    Past Surgical History:  Procedure Laterality Date  . BREAST REDUCTION SURGERY Bilateral 1997  . CARPAL TUNNEL RELEASE    . CERVICAL FUSION     [phone] and C6-7 2005  . COLONOSCOPY    . HEMORRHOID SURGERY  11/2014   thrombosed hemorroid opened in office  . shouder surgery Left 10/26/2017   rotator cuff  . VAGINAL HYSTERECTOMY  2000  . VULVECTOMY Left 03/14/2016   Procedure: LABIAL MASS EXCISION;  Surgeon: Megan Salon, MD;  Location: [facility];  Service: Gynecology;  Laterality: Left;  follow first case   Social History   Socioeconomic History  . Marital status: Married    Spouse name: Francee Piccolo  . Number of children: 1  . Years of education: 49  . Highest education level: Not on file  Occupational History  . housewife  Tobacco Use  . Smoking status: Never Smoker  . Smokeless tobacco: Never Used  Substance and Sexual Activity  . Alcohol use: No    Alcohol/week: 0.0 oz  . Drug use: No  . Sexual activity: Yes    Partners: Male    Birth control/protection: Surgical    Comment: TAH  Social History Narrative   Patient lives at home with her husband Francee Piccolo. Patient has one child and a high school education.    Patient is right-handed.  Patient drinks one cup of tea daily and occasionally a diet soda.   Current Outpatient Medications on File Prior to Visit  Medication Sig Dispense Refill  . Amylase-Lipase-Protease (PANCREASE PO) Take 3 tablets by mouth 3  (three) times daily with meals.    . Ascorbic Acid (VITAMIN C) 1000 MG tablet 1,000 mg daily.    [name] (BERBERINE COMPLEX PO) Take by mouth daily.    . Cholecalciferol (VITAMIN D3) 5000 units CAPS Take 5,000 Units by mouth every evening.    . Choline Dihydrogen Citrate (CHOLINE CITRATE PO) Take by mouth.    Marland Kitchen ibuprofen (ADVIL,MOTRIN) 200 MG tablet Take 800 mg by mouth 3 (three) times daily as needed for headache or moderate pain.    . INOSITOL PO Take 1 capsule by mouth daily.    . Magnesium 400 MG TABS Take 400 mg by mouth daily.    Marland Kitchen  metFORMIN (GLUCOPHAGE) 500 MG tablet Take 1,000 mg by mouth 2 (two) times daily.    . Omega-3 1000 MG CAPS Take by mouth.    . TURMERIC PO Take by mouth daily.    Marland Kitchen UNABLE TO FIND Med Name:  CBD hemp 60ng oil, phytocannbinoids 30mg  as needed    . vitamin B-12 (CYANOCOBALAMIN) 1000 MCG tablet Take by mouth daily.     No current facility-administered medications on file prior to visit.    Allergies  Allergen Reactions  . Eggs Or Egg-Derived Products     Upset GI  . Hydrocodone Nausea And Vomiting  . Metoclopramide     Anxiety  . Other Nausea And Vomiting and Diarrhea    ENTEX, Ragweed  . Macrobid [Nitrofurantoin Monohyd Macro] Rash   Family History  Problem Relation Age of Onset  . Heart Problems Mother   . High blood pressure Mother   . Diabetes Mother   . Dementia Mother   . Gout Father   . Diabetes Father   . Colon cancer Father    Pt has FH of DM in M, F, brothers.  PE: LMP 01/03/1998  Wt Readings from Last 3 Encounters:  03/15/18 244 lb (110.7 kg)  02/21/18 245 lb 12.8 oz (111.5 kg)  01/08/18 250 lb (113.4 kg)   Constitutional:  in NAD  The physical exam was not performed (telephone visit).  ASSESSMENT: 1. DM2, non--insulin-dependent, becoming uncontrolled, without long term complications, but with hyperglycemia  2. HL  3.  Thyroid nodules  PLAN:  1. Patient with longstanding, previously  diet-controlled diabetes, with worsening control after relaxing her diet.  We discussed at last visit about improving her diet. We restarted metformin, but she was not tolerating this well due to diarrhea and she stopped 2 weeks ago.  We scheduled this appointment to discuss alternatives. -At this visit, reviewing the sugars, they have increased after stopping metformin.  She feels that her diarrhea started after she refilled her metformin and got this from another [facility].  We discussed about alternatives for treatment: I suggested metformin ER and explained how this is different compared to the instant release Metformin.  We also discussed about possibly using a GLP-1 receptor agonist which I would have suggested if she did not have a history of pancreatitis as a child.  I also mentioned SGLT 2 inhibitors and the mechanism of action, but for now, she opted to try metformin ER. -I advised her to try to start with 500 mg with dinner and then increase to twice a day.  If she can tolerate this well, we can increase to a target dose of 1000 mg twice a day. - I suggested to:  Patient Instructions   Please start: - Metformin ER 500 mg with dinner for few days, then increase to 500 mg 2x a day with meals.  Continue to check sugars once a day.  Please return in 4 months with your sugar log.   - We will check her HbA1c when she returns to the clinic - continue checking sugars at different times of the day - check 1x a day, rotating checks - advised for yearly eye exams >> she is UTD - Return to clinic in 4 mo with sugar log    2. HL - Reviewed latest lipid panel from 01/2018 - these were not fasting, they were checked after she started [employer] to eat breakfast: LDL and triglycerides high Lab Results  Component Value Date   CHOL 229 (H)  01/08/2018   HDL 42.00 01/08/2018   LDLCALC 155 (H) 01/08/2018   TRIG 164.0 (H) 01/08/2018   CHOLHDL 5 01/08/2018  - Continues  omega-3 fatty  acids, but she is intolerant to statins due to previous muscle aches and weakness - We discussed at last visit about improving her diet  3.  Thyroid nodules -Reviewed the report of her thyroid ultrasound from 03/2015: The nodules are all subcentimeter and not worrisome. -She has no neck compression symptoms -I would not suggest another thyroid ultrasound unless she develops neck compression symptoms -Repeat TSH from last visit: TSH normal  - time spent with the patient: 16 min, of which >50% was spent in obtaining information about her symptoms, reviewing her previous labs, evaluations, and treatments, counseling her about her conditions (please see the discussed topics above), and developing a plan to further investigate and treat it; she had a number of questions which I addressed.  Philemon Kingdom, MD PhD [facility]

## 2018-05-22 NOTE — Telephone Encounter (Signed)
Patient scheduled for phone visit today and has been notified.

## 2018-05-22 NOTE — Telephone Encounter (Signed)
Spoke to patient, she does not have video capability for a virtual visit.  Please advise OV?

## 2018-05-22 NOTE — Telephone Encounter (Signed)
I can call her even today, if she agrees

## 2018-07-10 ENCOUNTER — Ambulatory Visit (INDEPENDENT_AMBULATORY_CARE_PROVIDER_SITE_OTHER): Payer: BC Managed Care – PPO | Admitting: Internal Medicine

## 2018-07-10 ENCOUNTER — Other Ambulatory Visit: Payer: Self-pay

## 2018-07-10 ENCOUNTER — Encounter: Payer: Self-pay | Admitting: Internal Medicine

## 2018-07-10 VITALS — BP 128/70 | HR 92 | Ht 63.25 in | Wt 241.0 lb

## 2018-07-10 DIAGNOSIS — E782 Mixed hyperlipidemia: Secondary | ICD-10-CM | POA: Diagnosis not present

## 2018-07-10 DIAGNOSIS — E1165 Type 2 diabetes mellitus with hyperglycemia: Secondary | ICD-10-CM

## 2018-07-10 DIAGNOSIS — E042 Nontoxic multinodular goiter: Secondary | ICD-10-CM

## 2018-07-10 LAB — POCT GLYCOSYLATED HEMOGLOBIN (HGB A1C): Hemoglobin A1C: 6 % — AB (ref 4.0–5.6)

## 2018-07-10 MED ORDER — METFORMIN HCL ER 500 MG PO TB24: 1000.0000 mg | ORAL_TABLET | Freq: Two times a day (BID) | ORAL | 3 refills | Status: DC

## 2018-07-10 NOTE — Patient Instructions (Addendum)
Please increase: - Metformin ER to [phone] mg 2x a day with meals. Start with 500 mg in am and 1000 mg with dinner.  Continue to check sugars once a day.  Please return in 6 months with your sugar log.

## 2018-07-10 NOTE — Progress Notes (Signed)
Patient ID: Jade Anderson, female   DOB: Nov 07, 1960, 58 y.o.   MRN: [id number]   HPI: Jade Anderson is a 58 y.o.-year-old female, presenting for follow-up for DM2, dx in 2010 non-insulin-dependent, without long term complications.  Last visit  1.5 months ago (virtual).  Last hemoglobin A1c was: Lab Results  Component Value Date   HGBA1C 5.6 01/08/2018   HGBA1C 5.7 (A) 09/08/2017   HGBA1C 6.6 (H) 03/09/2017   HGBA1C 6.2 (A) 02/23/2016   HGBA1C 6.0 (H) 12/25/2014   HGBA1C 5.7 (H) 12/19/2013  2010: HbA1c 9.4%  She is on: -Metformin ER 500 g twice a day- started 05/2018  - tolerates this very well. She was previously on Metformin 1000 mg 2x a day but developed diarrhea >> stopped 2 weeks prior to our last appointment- started chromium She was previously on metformin but came off after changing her diet as her HbA1c dropped to 4.9%.  We restarted metformin 03/2017.  Pt checks her sugars 0-1x a day: - am:112-139 >> 127-146, 161 >> 114-133, 157 >> 101-135 - 2h after b'fast: n/c >> 162 - before lunch: 89-121 >> 92-128 >> 89-100 >> 91, 92 - 2h after lunch: n/c >> 113 >> 128 >> 109-150 >> n/c - before dinner:  on Metf.: 96, off metf: 140, 164 >> 108-117 - 2h after dinner: 131 >> 109, 156 >> off Metf.: 160 >> 138-142 - bedtime: 103-135 >> 114-131, 145, 231 (Sick) >> n/c >> 99, 114 - nighttime: n/c Lowest sugar was 89 >> 91; she has hypoglycemia awareness in the 70s. Highest sugar was 206 >> 160.  She has a history of pancreatitis as a child, due to mom's.  Glucometer: ReliOn  Pt's meals are: - Breakfast: Oatmeal, sausage, bacon, biscuits, hash brown, fruit, peanut butter  (allergic to eggs) - Lunch: Lunch meat, hamburger, hot dog, salads - Dinner: Organic beef or chicken, vegetables, salads, bread - Snacks: 3: Fruit, nuts  -No CKD, last BUN/creatinine:  Lab Results  Component Value Date   BUN 11 01/08/2018   BUN 13 03/09/2017   CREATININE 0.78 01/08/2018   CREATININE 0.90  03/09/2017   -+ HL; last set of lipids: Lab Results  Component Value Date   CHOL 229 (H) 01/08/2018   HDL 42.00 01/08/2018   LDLCALC 155 (H) 01/08/2018   TRIG 164.0 (H) 01/08/2018   CHOLHDL 5 01/08/2018  On omega-3 fatty acids.  He is intolerant to statins- tried Lipitor and developed muscle aches cramps.  She refuses to restart statins. - last eye exam was in spring 2019: No DR.  On berberine. - no  numbness and tingling in her feet.  She has a history of fatty liver.  She had hair loss >> TFTs normal but at ULN >> started LT3 >> worsening diabetes >> stopped after 1 year.  Latest TSH was normal at last check: Lab Results  Component Value Date   TSH 1.60 01/08/2018   She has a history of thyroid nodules: 0.6 x 0.9 cm bilaterally, per U/S 03/31/2015.  She had a repeat U/S 10/08/2015 ([place]).  Pt denies: - feeling nodules in neck - hoarseness - dysphagia - choking - SOB with lying down  No FH of thyroid cancer.Uncle had a goiter. Also 2 cousins with Hashimoto's ds.  No h/o radiation tx to head or neck.  ROS: Constitutional: no weight gain/no weight loss, no fatigue, no subjective hyperthermia, no subjective hypothermia Eyes: no blurry vision, no xerophthalmia ENT: no sore throat, no nodules  palpated in neck, no dysphagia, no odynophagia, no hoarseness Cardiovascular: no CP/no SOB/no palpitations/no leg swelling Respiratory: no cough/no SOB/no wheezing Gastrointestinal: no N/no V/no D/no C/no acid reflux Musculoskeletal: no muscle aches/no joint aches Skin: no rashes, + hair loss Neurological: no tremors/no numbness/no tingling/no dizziness  I reviewed pt's medications, allergies, PMH, social hx, family hx, and changes were documented in the history of present illness. Otherwise, unchanged from my initial visit note.  Past Medical History:  Diagnosis Date  . Diabetes ([place])    no medication  . Fatty liver   . High cholesterol    no  medication  . History of hiatal hernia   . History of kidney stones   . Hypothyroidism 2017   followed by Dr. Ignacia Felling in [place]  . Migraine    silent  . Nerve pain left elbow following IV  . Nerve pain right thigh  . PONV (postoperative nausea and vomiting)   . 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 Shingles 2018  . Shoulder pain, left   . Sleep apnea    Past Surgical History:  Procedure Laterality Date  . BREAST REDUCTION SURGERY Bilateral 1997  . CARPAL TUNNEL RELEASE    . CERVICAL FUSION     [phone] and C6-7 2005  . COLONOSCOPY    . HEMORRHOID SURGERY  11/2014   thrombosed hemorroid opened in office  . shouder surgery Left 10/26/2017   rotator cuff  . VAGINAL HYSTERECTOMY  2000  . VULVECTOMY Left 03/14/2016   Procedure: LABIAL MASS EXCISION;  Surgeon: Megan Salon, MD;  Location: [facility];  Service: Gynecology;  Laterality: Left;  follow first case   Social History   Socioeconomic History  . Marital status: Married    Spouse name: Jade Anderson  . Number of children: 1  . Years of education: 76  . Highest education level: Not on file  Occupational History  . housewife  Tobacco Use  . Smoking status: Never Smoker  . Smokeless tobacco: Never Used  Substance and Sexual Activity  . Alcohol use: No    Alcohol/week: 0.0 oz  . Drug use: No  . Sexual activity: Yes    Partners: Male    Birth control/protection: Surgical    Comment: TAH  Social History Narrative   Patient lives at home with her husband Jade Anderson. Patient has one child and a high school education.    Patient is right-handed.  Patient drinks one cup of tea daily and occasionally a diet soda.   Current Outpatient Medications on File Prior to Visit  Medication Sig Dispense Refill  . Amylase-Lipase-Protease (PANCREASE PO) Take 3 tablets by mouth 3 (three) times daily with meals.    . Ascorbic Acid (VITAMIN C) 1000 MG tablet 1,000 mg daily.    [name] (BERBERINE COMPLEX PO) Take by  mouth daily.    . Cholecalciferol (VITAMIN D3) 5000 units CAPS Take 5,000 Units by mouth every evening.    . Choline Dihydrogen Citrate (CHOLINE CITRATE PO) Take by mouth.    Marland Kitchen ibuprofen (ADVIL,MOTRIN) 200 MG tablet Take 800 mg by mouth 3 (three) times daily as needed for headache or moderate pain.    . INOSITOL PO Take 1 capsule by mouth daily.    . Magnesium 400 MG TABS Take 400 mg by mouth daily.    . metFORMIN (GLUCOPHAGE-XR) 500 MG 24 hr tablet Take 1 tablet (500 mg total) by mouth 2 (two) times daily after a meal. 60 tablet 5  .  Omega-3 1000 MG CAPS Take by mouth.    . TURMERIC PO Take by mouth daily.    Marland Kitchen UNABLE TO FIND Med Name:  CBD hemp 60ng oil, phytocannbinoids 30mg  as needed    . vitamin B-12 (CYANOCOBALAMIN) 1000 MCG tablet Take by mouth daily.     No current facility-administered medications on file prior to visit.    Allergies  Allergen Reactions  . Eggs Or Egg-Derived Products     Upset GI  . Hydrocodone Nausea And Vomiting  . Metoclopramide     Anxiety  . Other Nausea And Vomiting and Diarrhea    ENTEX, Ragweed  . Macrobid [Nitrofurantoin Monohyd Macro] Rash   Family History  Problem Relation Age of Onset  . Heart Problems Mother   . High blood pressure Mother   . Diabetes Mother   . Dementia Mother   . Gout Father   . Diabetes Father   . Colon cancer Father    Pt has FH of DM in M, F, brothers.  PE: BP 128/70   Pulse 92   Ht 5' 3.25" (1.607 m)   Wt 241 lb (109.3 kg)   LMP 01/03/1998   SpO2 98%   BMI 42.35 kg/m  Wt Readings from Last 3 Encounters:  07/10/18 241 lb (109.3 kg)  03/15/18 244 lb (110.7 kg)  02/21/18 245 lb 12.8 oz (111.5 kg)   Constitutional: overweight, in NAD Eyes: PERRLA, EOMI, no exophthalmos ENT: moist mucous membranes, no thyromegaly, no cervical lymphadenopathy Cardiovascular: RRR, No MRG Respiratory: CTA B Gastrointestinal: abdomen soft, NT, ND, BS+ Musculoskeletal: no deformities, strength intact in all 4 Skin: moist,  warm, no rashes Neurological: no tremor with outstretched hands, DTR normal in all 4  ASSESSMENT: 1. DM2, non--insulin-dependent, becoming uncontrolled, without long term complications, but with hyperglycemia  2. HL  3.  Thyroid nodules  PLAN:  1. Patient with longstanding, previously diet-controlled diabetes, with worsening control after relaxing her diet.  In the past, she was on metformin, but she did not tolerate instant diarrhea and she stopped 2 weeks prior to our last appointment.  I advised her to restart this in an extended release formulation.  We also discussed at that time that we could use an SGLT 2 inhibitor.  GLP-1 receptor agonist would not be suggested due to previous history of pancreatitis, however, this was very distant, as a child, secondary to months.  She opted to try the metformin ER she actually At this visit, she tells me that she tolerates well the metformin ER.  She actually tried to increase the dose to 1000 mg occasionally and she tolerated this well and sugars were better.  For now, I advised her to stay with 500 mg in a.m. and increase the dose to 1000 mg with dinner since sugars were better when using this dose at night. - I suggested to:  Patient Instructions  Please increase: - Metformin ER to [phone] mg 2x a day with meals. Start with 500 mg in am and 1000 mg with dinner.  Continue to check sugars once a day.  Please return in 6 months with your sugar log.   - we checked her HbA1c: 6.0% (higher) - advised to check sugars at different times of the day - 1x a day, rotating check times - advised for yearly eye exams >> she is UTD - return to clinic in 6 months   2. HL - Reviewed latest lipid panel from 01/2018: LDL above target, triglycerides slightly high Lab Results  Component Value Date   CHOL 229 (H) 01/08/2018   HDL 42.00 01/08/2018   LDLCALC 155 (H) 01/08/2018   TRIG 164.0 (H) 01/08/2018   CHOLHDL 5 01/08/2018  - Continues omega-3 fatty  acids, but she is intolerant to statins due to previous muscle aches and weakness.  3.  Thyroid nodules -Reviewed the report of her latest thyroid ultrasound from 2017: The nodules are all subcentimeter and not worrisome. -No neck compression symptoms -I would not suggest another thyroid ultrasound unless she develops neck compression symptoms -Reviewed her latest TSH >> normal.  Philemon Kingdom, MD PhD [facility] Endocrinology

## 2019-01-08 ENCOUNTER — Other Ambulatory Visit: Payer: Self-pay

## 2019-01-10 ENCOUNTER — Ambulatory Visit (INDEPENDENT_AMBULATORY_CARE_PROVIDER_SITE_OTHER): Payer: BC Managed Care – PPO | Admitting: Internal Medicine

## 2019-01-10 ENCOUNTER — Encounter: Payer: Self-pay | Admitting: Internal Medicine

## 2019-01-10 VITALS — BP 120/76 | HR 88 | Ht 63.25 in | Wt 239.0 lb

## 2019-01-10 DIAGNOSIS — E042 Nontoxic multinodular goiter: Secondary | ICD-10-CM | POA: Diagnosis not present

## 2019-01-10 DIAGNOSIS — E782 Mixed hyperlipidemia: Secondary | ICD-10-CM | POA: Diagnosis not present

## 2019-01-10 DIAGNOSIS — E1165 Type 2 diabetes mellitus with hyperglycemia: Secondary | ICD-10-CM

## 2019-01-10 LAB — LIPID PANEL
Cholesterol: 245 mg/dL — ABNORMAL HIGH (ref 0–200)
HDL: 48.5 mg/dL (ref >39.00)
LDL Cholesterol: 157 mg/dL — ABNORMAL HIGH (ref 0–99)
NonHDL: 196.7
Total CHOL/HDL Ratio: 5
Triglycerides: 199 mg/dL — ABNORMAL HIGH (ref 0.0–149.0)
VLDL: 39.8 mg/dL (ref 0.0–40.0)

## 2019-01-10 LAB — POCT GLYCOSYLATED HEMOGLOBIN (HGB A1C): Hemoglobin A1C: 5.7 % — AB (ref 4.0–5.6)

## 2019-01-10 LAB — MICROALBUMIN / CREATININE URINE RATIO
Creatinine,U: 166.8 mg/dL
Microalb Creat Ratio: 0.8 mg/g (ref 0.0–30.0)
Microalb, Ur: 1.3 mg/dL (ref 0.0–1.9)

## 2019-01-10 LAB — TSH: TSH: 2.02 u[IU]/mL (ref 0.35–4.50)

## 2019-01-10 NOTE — Patient Instructions (Addendum)
Please continue: ?- Metformin ER 1000 mg 2x a day with meals. ? ?Continue to check sugars once a day. ? ?Please stop at the lab. ? ?Please return in 6 months with your sugar log.  ?

## 2019-01-10 NOTE — Progress Notes (Signed)
Patient ID: Jade Anderson, female   DOB: 1960/10/05, 59 y.o.   MRN: [id number]   This visit occurred during the SARS-CoV-2 public health emergency.  Safety protocols were in place, including screening questions prior to the visit, additional usage of staff PPE, and extensive cleaning of exam room while observing appropriate contact time as indicated for disinfecting solutions.   HPI: Jade Anderson is a 59 y.o.-year-old female, presenting for follow-up for DM2, dx in 2010 non-insulin-dependent, without long term complications.  Last visit 6 months ago.  Reviewed HbA1c levels: Lab Results  Component Value Date   HGBA1C 6.0 (A) 07/10/2018   HGBA1C 5.6 01/08/2018   HGBA1C 5.7 (A) 09/08/2017   HGBA1C 6.6 (H) 03/09/2017   HGBA1C 6.2 (A) 02/23/2016   HGBA1C 6.0 (H) 12/25/2014   HGBA1C 5.7 (H) 12/19/2013  2010: HbA1c 9.4%  She is on: -Metformin ER 500 g 2x a day >> 1000 mg 2x a day -started 05/2018-tolerates this well She was previously on Metformin 1000 mg 2x a day but developed diarrhea >> stopped 2 weeks prior to our last appointment- started chromium She was previously on metformin but came off after changing her diet as her HbA1c dropped to 4.9%.  We restarted metformin 03/2017.  Pt checks her sugars 0 to once a day: - am: 114-133, 157 >> 101-135 >> 106-137, 156 - 2h after b'fast: n/c >> 162 >> n/c - before lunch: 92-128 >> 89-100 >> 91, 92 >> 95 - 2h after lunch: 128 >> 109-150 >> n/c >> 116, 205 - before dinner: 96, 140, 164 >> 108-117 >> 90-112 - 2h after dinner: off Metf.: 160 >> 138-142 >> 101-148 - bedtime: 114-131, 145, 231 >> n/c >> 99, 114 >> 100-131, 156 - nighttime: n/c Lowest sugar was 89 >> 91 >> 90; she has hypoglycemia awareness in the 70s. Highest sugar was 206 >> 160 >> 205 (dessert).  She has a history of pancreatitis as a child, due to mumps.  Glucometer: ReliOn  Pt's meals are: - Breakfast: Oatmeal, sausage, bacon, biscuits, hash brown, fruit, peanut butter   (allergic to eggs) - Lunch: Lunch meat, hamburger, hot dog, salads - Dinner: Organic beef or chicken, vegetables, salads, bread - Snacks: 3: Fruit, nuts  -No CKD, last BUN/creatinine:  Lab Results  Component Value Date   BUN 11 01/08/2018   BUN 13 03/09/2017   CREATININE 0.78 01/08/2018   CREATININE 0.90 03/09/2017   -+ HL; last set of lipids: Lab Results  Component Value Date   CHOL 229 (H) 01/08/2018   HDL 42.00 01/08/2018   LDLCALC 155 (H) 01/08/2018   TRIG 164.0 (H) 01/08/2018   CHOLHDL 5 01/08/2018  On omega-3 fatty acids.  She is intolerant to statins -tried Lipitor and developed muscle aches.  She refuses to retry statins. - last eye exam was in spring 2019: No DR.  On berberine. Exam coming up in March 2020. - she denies numbness and tingling in her feet.  She had hair loss >> TFTs normal but at ULN >> started LT3 >> worsening diabetes  >> stopped after 1 year  Latest TSH was normal at last check: Lab Results  Component Value Date   TSH 1.60 01/08/2018   She has a history of thyroid nodules: 0.6 x 0.9 cm bilaterally, per U/S 03/31/2015.  She had a repeat U/S 10/08/2015 ([place]).  Pt denies: - feeling nodules in neck - hoarseness - choking - SOB with lying down + Intermittent dysphagia -after her  C-spine surgery.  No FH of thyroid cancer.Uncle had a goiter. Also 2 cousins with Hashimoto's ds.  She has no history of radiation treatment to head or neck.  She is not on biotin or herbal supplements.  ROS: Constitutional: no weight gain/no weight loss, no fatigue, no subjective hyperthermia, no subjective hypothermia Eyes: no blurry vision, no xerophthalmia ENT: no sore throat, + see HPI Cardiovascular: no CP/no SOB/no palpitations/no leg swelling Respiratory: no cough/no SOB/no wheezing Gastrointestinal: no N/no V/no D/no C/no acid reflux Musculoskeletal: no muscle aches/no joint aches Skin: no rashes, + hair loss Neurological: no  tremors/no numbness/no tingling/no dizziness  I reviewed pt's medications, allergies, PMH, social hx, family hx, and changes were documented in the history of present illness. Otherwise, unchanged from my initial visit note.  Past Medical History:  Diagnosis Date  . Diabetes ([place])    no medication  . Fatty liver   . High cholesterol    no medication  . History of hiatal hernia   . History of kidney stones   . Hypothyroidism 2017   followed by Dr. Ignacia Felling in [place]  . Migraine    silent  . Nerve pain left elbow following IV  . Nerve pain right thigh  . PONV (postoperative nausea and vomiting)   . 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 Shingles 2018  . Shoulder pain, left   . Sleep apnea    Past Surgical History:  Procedure Laterality Date  . BREAST REDUCTION SURGERY Bilateral 1997  . CARPAL TUNNEL RELEASE    . CERVICAL FUSION     [phone] and C6-7 2005  . COLONOSCOPY    . HEMORRHOID SURGERY  11/2014   thrombosed hemorroid opened in office  . shouder surgery Left 10/26/2017   rotator cuff  . VAGINAL HYSTERECTOMY  2000  . VULVECTOMY Left 03/14/2016   Procedure: LABIAL MASS EXCISION;  Surgeon: Megan Salon, MD;  Location: [facility];  Service: Gynecology;  Laterality: Left;  follow first case   Social History   Socioeconomic History  . Marital status: Married    Spouse name: Jade Anderson  . Number of children: 1  . Years of education: 1  . Highest education level: Not on file  Occupational History  . housewife  Tobacco Use  . Smoking status: Never Smoker  . Smokeless tobacco: Never Used  Substance and Sexual Activity  . Alcohol use: No    Alcohol/week: 0.0 oz  . Drug use: No  . Sexual activity: Yes    Partners: Male    Birth control/protection: Surgical    Comment: TAH  Social History Narrative   Patient lives at home with her husband Jade Anderson. Patient has one child and a high school education.    Patient is right-handed.  Patient drinks one cup of tea daily and  occasionally a diet soda.   Current Outpatient Medications on File Prior to Visit  Medication Sig Dispense Refill  . Amylase-Lipase-Protease (PANCREASE PO) Take 3 tablets by mouth 3 (three) times daily with meals.    . Ascorbic Acid (VITAMIN C) 1000 MG tablet 1,000 mg daily.    [name] (BERBERINE COMPLEX PO) Take by mouth daily.    . Cholecalciferol (VITAMIN D3) 5000 units CAPS Take 5,000 Units by mouth every evening.    . Choline Dihydrogen Citrate (CHOLINE CITRATE PO) Take by mouth.    Marland Kitchen ibuprofen (ADVIL,MOTRIN) 200 MG tablet Take 800 mg by mouth 3 (three) times daily as needed for headache or moderate  pain.    . INOSITOL PO Take 1 capsule by mouth daily.    . Magnesium 400 MG TABS Take 400 mg by mouth daily.    . metFORMIN (GLUCOPHAGE-XR) 500 MG 24 hr tablet Take 2 tablets (1,000 mg total) by mouth 2 (two) times daily after a meal. 360 tablet 3  . Omega-3 1000 MG CAPS Take by mouth.    . TURMERIC PO Take by mouth daily.    Marland Kitchen UNABLE TO FIND Med Name:  CBD hemp 60ng oil, phytocannbinoids 30mg  as needed    . vitamin B-12 (CYANOCOBALAMIN) 1000 MCG tablet Take by mouth daily.     No current facility-administered medications on file prior to visit.   Allergies  Allergen Reactions  . Eggs Or Egg-Derived Products     Upset GI  . Hydrocodone Nausea And Vomiting  . Metoclopramide     Anxiety  . Other Nausea And Vomiting and Diarrhea    ENTEX, Ragweed  . Macrobid [Nitrofurantoin Monohyd Macro] Rash   Family History  Problem Relation Age of Onset  . Heart Problems Mother   . High blood pressure Mother   . Diabetes Mother   . Dementia Mother   . Gout Father   . Diabetes Father   . Colon cancer Father    Pt has FH of DM in M, F, brothers.  PE: BP 120/76   Pulse 88   Ht 5' 3.25" (1.607 m)   Wt 239 lb (108.4 kg)   LMP 01/03/1998   SpO2 99%   BMI 42.00 kg/m  Wt Readings from Last 3 Encounters:  01/10/19 239 lb (108.4 kg)  07/10/18 241 lb (109.3 kg)   03/15/18 244 lb (110.7 kg)   Constitutional: overweight, in NAD Eyes: PERRLA, EOMI, no exophthalmos ENT: moist mucous membranes, no thyromegaly, no cervical lymphadenopathy Cardiovascular: RRR, No MRG Respiratory: CTA B Gastrointestinal: abdomen soft, NT, ND, BS+ Musculoskeletal: no deformities, strength intact in all 4 Skin: moist, warm, no rashes Neurological: no tremor with outstretched hands, DTR normal in all 4  ASSESSMENT: 1. DM2, non-insulin-dependent, becoming uncontrolled, without long term complications, but with hyperglycemia  2. HL  3.  Thyroid nodules  PLAN:  1. Patient with longstanding, previously diet-controlled diabetes, with worsening control after relaxing her diet.  She was on regular Metformin in the past but she did not tolerate this due to diarrhea.  We restarted this in the extended release form and she is tolerating it well.  At last visit we increased the dose since her sugars are higher, as well as her HbA1c. -At last visit, we discussed about the possible use of an SGLT 2 inhibitor or even GLP-1 receptor agonist.  She has a history of pancreatitis however, this was very distant, as a child, due to mumps.  -At this visit, sugars are mostly at goal with occasional very mild hyperglycemic spikes after dietary indiscretion.  She only had 1 very high blood sugar in the 200s after a lunch with dessert.  We discussed about the possibility of decreasing the dose of Metformin but she would like to keep it the same for now.  I advised her to decrease the dose to 1000 mg at night if she starts to see sugars mostly in the 90-110 range.  Otherwise, we can continue the same dose until next visit and then try to decrease the dose at that time, if sugars remain as well controlled as now - I suggested to:  Patient Instructions  Please continue: - Metformin  ER 1000 mg 2x a day with meals.  Continue to check sugars once a day.  Please stop at the lab.  Please return in 6  months with your sugar log.   - we checked her HbA1c: 5.7% - lower, excellent - advised to check sugars at different times of the day - 1x a day, rotating check times - advised for yearly eye exams >> she is not UTD but has an exam coming up - will check annual labs today - return to clinic in 6 months   2. HL - Reviewed latest lipid panel from 01/2018: LDL above goal, triglycerides slightly high, HDL at goal Lab Results  Component Value Date   CHOL 229 (H) 01/08/2018   HDL 42.00 01/08/2018   LDLCALC 155 (H) 01/08/2018   TRIG 164.0 (H) 01/08/2018   CHOLHDL 5 01/08/2018  - Continues omega-3 fatty acids, but she is intolerant to statins due to previous muscle aches and weakness. - recheck lipids today  3.  Thyroid nodules -Reviewed the report of her latest thyroid ultrasound from 2017: The nodules are all subcentimeter and not worrisome -She does not have neck compression symptoms -Latest TSH was reviewed and this was normal and we would recheck this today -I would not suggest another thyroid ultrasound unless she starts to develop neck compression symptoms  Component     Latest Ref Rng & Units 01/10/2019  Glucose     65 - 99 mg/dL 95  BUN     7 - 25 mg/dL 15  Creatinine     0.50 - 1.05 mg/dL 0.85  GFR, Est Non African American     > OR = 60 mL/min/1.37m2 76  GFR, Est African American     > OR = 60 mL/min/1.68m2 88  BUN/Creatinine Ratio     6 - 22 (calc) NOT APPLICABLE  Sodium     [id number] - 146 mmol/L 141  Potassium     3.5 - 5.3 mmol/L 4.4  Chloride     98 - 110 mmol/L 104  CO2     20 - 32 mmol/L 26  Calcium     8.6 - 10.4 mg/dL 10.0  Total Protein     6.1 - 8.1 g/dL 6.7  Albumin MSPROF     3.6 - 5.1 g/dL 4.2  Globulin     1.9 - 3.7 g/dL (calc) 2.5  AG Ratio     1.0 - 2.5 (calc) 1.7  Total Bilirubin     0.2 - 1.2 mg/dL 0.5  Alkaline phosphatase (APISO)     37 - 153 U/L 64  AST     10 - 35 U/L 27  ALT     6 - 29 U/L 40 (H)  Cholesterol     0 - 200 mg/dL 245  (H)  Triglycerides     0.0 - 149.0 mg/dL 199.0 (H)  HDL Cholesterol     >39.00 mg/dL 48.50  VLDL     0.0 - 40.0 mg/dL 39.8  LDL (calc)     0 - 99 mg/dL 157 (H)  Total CHOL/HDL Ratio      5  NonHDL      196.70  Microalb, Ur     0.0 - 1.9 mg/dL 1.3  Creatinine,U     mg/dL 166.8  MICROALB/CREAT RATIO     0.0 - 30.0 mg/g 0.8  TSH     0.35 - 4.50 uIU/mL 2.02   LDL level still high.  Triglycerides also high.  Will  suggest Zetia. ALT elevated, but slightly better. Normal ACR Normal TSH.  Philemon Kingdom, MD PhD [facility] Endocrinology

## 2019-01-11 ENCOUNTER — Telehealth: Payer: Self-pay

## 2019-01-11 LAB — COMPLETE METABOLIC PANEL WITH GFR
AG Ratio: 1.7 (calc) (ref 1.0–2.5)
ALT: 40 U/L — ABNORMAL HIGH (ref 6–29)
AST: 27 U/L (ref 10–35)
Albumin: 4.2 g/dL (ref 3.6–5.1)
Alkaline phosphatase (APISO): 64 U/L (ref 37–153)
BUN: 15 mg/dL (ref 7–25)
CO2: 26 mmol/L (ref 20–32)
Calcium: 10 mg/dL (ref 8.6–10.4)
Chloride: 104 mmol/L (ref 98–110)
Creat: 0.85 mg/dL (ref 0.50–1.05)
GFR, Est African American: 88 mL/min/{1.73_m2} (ref >=60)
GFR, Est Non African American: 76 mL/min/{1.73_m2} (ref >=60)
Globulin: 2.5 g/dL (calc) (ref 1.9–3.7)
Glucose, Bld: 95 mg/dL (ref 65–99)
Potassium: 4.4 mmol/L (ref 3.5–5.3)
Sodium: 141 mmol/L (ref 135–146)
Total Bilirubin: 0.5 mg/dL (ref 0.2–1.2)
Total Protein: 6.7 g/dL (ref 6.1–8.1)

## 2019-01-11 NOTE — Telephone Encounter (Signed)
-----   Message from Philemon Kingdom, MD sent at 01/11/2019 10:30 AM EST ----- Melissa, can you please call pt:  LDL level still high.  Triglycerides also high.  Would she want to try Zetia?  This is not a statin.  If so, let us send ezetimibe 10 mg to her pharmacy ALT (liver test) elevated, but slightly better. Normal urinary proteins Normal TSH.

## 2019-01-14 NOTE — Telephone Encounter (Signed)
Spoke with patient, she will think over starting the Zetia and let us know.  I have also printed and mailed her lab results per her request.

## 2019-01-14 NOTE — Telephone Encounter (Signed)
Left message for patient to return our call at [phone].  

## 2019-07-11 ENCOUNTER — Encounter: Payer: Self-pay | Admitting: Internal Medicine

## 2019-07-11 ENCOUNTER — Ambulatory Visit: Payer: BC Managed Care – PPO | Admitting: Internal Medicine

## 2019-07-11 ENCOUNTER — Other Ambulatory Visit: Payer: Self-pay

## 2019-07-11 VITALS — BP 130/78 | HR 88 | Ht 63.25 in | Wt 244.0 lb

## 2019-07-11 DIAGNOSIS — E042 Nontoxic multinodular goiter: Secondary | ICD-10-CM

## 2019-07-11 DIAGNOSIS — E1165 Type 2 diabetes mellitus with hyperglycemia: Secondary | ICD-10-CM

## 2019-07-11 DIAGNOSIS — E782 Mixed hyperlipidemia: Secondary | ICD-10-CM | POA: Diagnosis not present

## 2019-07-11 LAB — POCT GLYCOSYLATED HEMOGLOBIN (HGB A1C): Hemoglobin A1C: 5.8 % — AB (ref 4.0–5.6)

## 2019-07-11 MED ORDER — METFORMIN HCL ER 500 MG PO TB24: 1000.0000 mg | ORAL_TABLET | Freq: Two times a day (BID) | ORAL | 3 refills | Status: DC

## 2019-07-11 NOTE — Addendum Note (Signed)
Addended by: Cardell Peach I on: 07/11/2019 11:49 AM   Modules accepted: Orders

## 2019-07-11 NOTE — Patient Instructions (Signed)
Please continue: - Metformin ER 1000 mg 2x a day with meals.  Continue to check sugars once a day.  Please return in 6 months with your sugar log.  

## 2019-07-11 NOTE — Progress Notes (Signed)
Patient ID: Jade Anderson, female   DOB: 1960-05-07, 59 y.o.   MRN: [id number]   This visit occurred during the SARS-CoV-2 public health emergency.  Safety protocols were in place, including screening questions prior to the visit, additional usage of staff PPE, and extensive cleaning of exam room while observing appropriate contact time as indicated for disinfecting solutions.    HPI: Jade Anderson is a 59 y.o.-year-old female, presenting for follow-up for DM2, dx in 2010 non-insulin-dependent, without long term complications.  Last visit 6 months ago.  Reviewed HbA1c levels: Lab Results  Component Value Date   HGBA1C 5.7 (A) 01/10/2019   HGBA1C 6.0 (A) 07/10/2018   HGBA1C 5.6 01/08/2018   HGBA1C 5.7 (A) 09/08/2017   HGBA1C 6.6 (H) 03/09/2017   HGBA1C 6.2 (A) 02/23/2016   HGBA1C 6.0 (H) 12/25/2014   HGBA1C 5.7 (H) 12/19/2013  2010: HbA1c 9.4%  She is on: - Metformin ER 500 g 2x a day >> 1000 mg 2x a day -started 05/2018-tolerates this well, initially had diarrhea with this - Berberine - Chromium  Pt checks her sugars 0-1x a day: - am: 114-133, 157 >> 101-135 >> 106-137, 156 >> 113-142, 150 - 2h after b'fast: n/c >> 162 >> n/c - before lunch: 92-128 >> 89-100 >> 91, 92 >> 95 >> 110-118 - 2h after lunch: 128 >> 109-150 >> n/c >> 116, 205 >> 93, 180 - before dinner: 96, 140, 164 >> 108-117 >> 90-112 >> 97 - 2h after dinner:  160 >> 138-142 >> 101-148 >> 142-160 - bedtime:  n/c >> 99, 114 >> 100-131, 156 >> n/c - nighttime: n/c Lowest sugar was 89 >> 91 >> 90 >> 93; she has hypoglycemia awareness in the 70s. Highest sugar was 206 >> 160 >> 205 (dessert) >> 182.  She has a history of pancreatitis as a child, due to mumps.  Glucometer: ReliOn  Pt's meals are: - Breakfast: Oatmeal, sausage, bacon, biscuits, hash brown, fruit, peanut butter  (allergic to eggs) - Lunch: Lunch meat, hamburger, hot dog, salads - Dinner: Organic beef or chicken, vegetables, salads, bread - Snacks: 3:  Fruit, nuts  -No CKD, last BUN/creatinine:  Lab Results  Component Value Date   BUN 15 01/10/2019   BUN 11 01/08/2018   CREATININE 0.85 01/10/2019   CREATININE 0.78 01/08/2018   -+ HL; last set of lipids: Lab Results  Component Value Date   CHOL 245 (H) 01/10/2019   HDL 48.50 01/10/2019   LDLCALC 157 (H) 01/10/2019   TRIG 199.0 (H) 01/10/2019   CHOLHDL 5 01/10/2019  On omega-3 fatty acids.  She is intolerant to statins: Muscle aches with Lipitor.  She refused to try statins again.  I suggested Zetia at last visit. She tried it and believes that she had diarrhea (cannot remember exactly). She would not want to retry it. - last eye exam was in 03/2019: No DR reportedly  On berberine.  - no numbness and tingling in her feet.  She had hair loss >> TFTs normal but at ULN >> started LT3 >> worsening diabetes  >> stopped after 1 year.  Latest TSH was normal: Lab Results  Component Value Date   TSH 2.02 01/10/2019   She has a history of thyroid nodules: 0.6 x 0.9 cm bilaterally, per U/S 03/31/2015.  She had a repeat U/S 10/08/2015 ([place]).  Pt denies: - feeling nodules in neck - hoarseness - choking - SOB with lying down + Intermittent dysphagia after her C-spine surgery  No family history of thyroid cancer.  Her uncle had a goiter.  Also 2 cousins with Hashimoto's ds.  No history of radiation treatment to head or neck.  She is not on biotin or herbal supplements.  ROS: Constitutional: no weight gain/no weight loss, no fatigue, no subjective hyperthermia, no subjective hypothermia Eyes: no blurry vision, no xerophthalmia ENT: no sore throat, + see HPI Cardiovascular: no CP/no SOB/no palpitations/no leg swelling Respiratory: no cough/no SOB/no wheezing Gastrointestinal: no N/no V/no D/no C/no acid reflux Musculoskeletal: no muscle aches/no joint aches Skin: no rashes, no hair loss Neurological: no tremors/no numbness/no tingling/no  dizziness  I reviewed pt's medications, allergies, PMH, social hx, family hx, and changes were documented in the history of present illness. Otherwise, unchanged from my initial visit note.  Past Medical History:  Diagnosis Date  . Diabetes ([place])    no medication  . Fatty liver   . High cholesterol    no medication  . History of hiatal hernia   . History of kidney stones   . Hypothyroidism 2017   followed by Dr. Ignacia Felling in [place]  . Migraine    silent  . Nerve pain left elbow following IV  . Nerve pain right thigh  . PONV (postoperative nausea and vomiting)   . 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 Shingles 2018  . Shoulder pain, left   . Sleep apnea    Past Surgical History:  Procedure Laterality Date  . BREAST REDUCTION SURGERY Bilateral 1997  . CARPAL TUNNEL RELEASE    . CERVICAL FUSION     [id number] and C6-7 2005  . COLONOSCOPY    . HEMORRHOID SURGERY  11/2014   thrombosed hemorroid opened in office  . shouder surgery Left 10/26/2017   rotator cuff  . VAGINAL HYSTERECTOMY  2000  . VULVECTOMY Left 03/14/2016   Procedure: LABIAL MASS EXCISION;  Surgeon: Megan Salon, MD;  Location: [facility];  Service: Gynecology;  Laterality: Left;  follow first case   Social History   Socioeconomic History  . Marital status: Married    Spouse name: Francee Piccolo  . Number of children: 1  . Years of education: 33  . Highest education level: Not on file  Occupational History  . housewife  Tobacco Use  . Smoking status: Never Smoker  . Smokeless tobacco: Never Used  Substance and Sexual Activity  . Alcohol use: No    Alcohol/week: 0.0 oz  . Drug use: No  . Sexual activity: Yes    Partners: Male    Birth control/protection: Surgical    Comment: TAH  Social History Narrative   Patient lives at home with her husband Francee Piccolo. Patient has one child and a high school education.    Patient is right-handed.  Patient drinks one cup of tea daily and occasionally a diet soda.   Current  Outpatient Medications on File Prior to Visit  Medication Sig Dispense Refill  . Amylase-Lipase-Protease (PANCREASE PO) Take 3 tablets by mouth 3 (three) times daily with meals.    . Ascorbic Acid (VITAMIN C) 1000 MG tablet 1,000 mg daily.    [name] (BERBERINE COMPLEX PO) Take by mouth daily.    . Cholecalciferol (VITAMIN D3) 5000 units CAPS Take 5,000 Units by mouth every evening.    . Choline Dihydrogen Citrate (CHOLINE CITRATE PO) Take by mouth.    Marland Kitchen ibuprofen (ADVIL,MOTRIN) 200 MG tablet Take 800 mg by mouth 3 (three) times daily as needed for headache or moderate  pain.    . INOSITOL PO Take 1 capsule by mouth daily.    . Magnesium 400 MG TABS Take 400 mg by mouth daily.    . metFORMIN (GLUCOPHAGE-XR) 500 MG 24 hr tablet Take 2 tablets (1,000 mg total) by mouth 2 (two) times daily after a meal. 360 tablet 3  . Omega-3 1000 MG CAPS Take by mouth.    . TURMERIC PO Take by mouth daily.    Marland Kitchen UNABLE TO FIND Med Name:  CBD hemp 60ng oil, phytocannbinoids 30mg  as needed    . vitamin B-12 (CYANOCOBALAMIN) 1000 MCG tablet Take by mouth daily.     No current facility-administered medications on file prior to visit.   Allergies  Allergen Reactions  . Eggs Or Egg-Derived Products     Upset GI  . Hydrocodone Nausea And Vomiting  . Metoclopramide     Anxiety  . Other Nausea And Vomiting and Diarrhea    ENTEX, Ragweed  . Macrobid [Nitrofurantoin Monohyd Macro] Rash   Family History  Problem Relation Age of Onset  . Heart Problems Mother   . High blood pressure Mother   . Diabetes Mother   . Dementia Mother   . Gout Father   . Diabetes Father   . Colon cancer Father    Pt has FH of DM in M, F, brothers.  PE: BP 130/78   Pulse 88   Ht 5' 3.25" (1.607 m)   Wt 244 lb (110.7 kg)   LMP 01/03/1998   SpO2 97%   BMI 42.88 kg/m  Wt Readings from Last 3 Encounters:  07/11/19 244 lb (110.7 kg)  01/10/19 239 lb (108.4 kg)  07/10/18 241 lb (109.3 kg)    Constitutional: overweight, in NAD Eyes: PERRLA, EOMI, no exophthalmos ENT: moist mucous membranes, no thyromegaly, no cervical lymphadenopathy Cardiovascular: RRR, No MRG Respiratory: CTA B Gastrointestinal: abdomen soft, NT, ND, BS+ Musculoskeletal: no deformities, strength intact in all 4 Skin: moist, warm, no rashes Neurological: no tremor with outstretched hands, DTR normal in all 4  ASSESSMENT: 1. DM2, non-insulin-dependent, becoming uncontrolled, without long term complications, but with hyperglycemia  2. HL  3.  Thyroid nodules  PLAN:  1. Patient with longstanding, previously diet-controlled diabetes with worsening control after relaxing her diet.  She was on regular Metformin in the past but did not tolerate this well due to diarrhea.  She is tolerating the extended release Metformin well.  We started this back in 05/2018.  Her HbA1c improved afterwards, and it was 5.7% at last visit.  We did not change her regimen at that time.  She did have occasional hyperglycemic spikes at that time, due to dietary indiscretions.  We did discuss about possibly decreasing the dose of Metformin but she wanted to stay on the same dose. -We discussed about the possible use of SGLT2 inhibitor and GLP-1 receptor agonist in the past.  She has a history of pancreatitis however, this was very distant, as a child, due to mumps.  -At this visit, sugars are slightly higher than before but still mostly at goal.  She has some higher blood sugars in the morning, but not enough to trigger a change in her regimen.  For now, we can continue with Metformin ER. - I suggested to:  Patient Instructions  Please continue: - Metformin ER 1000 mg 2x a day with meals.  Continue to check sugars once a day.  Please return in 6 months with your sugar log.   - we checked  her HbA1c: 5.8% (still excellent) - advised to check sugars at different times of the day - 1x a day, rotating check times - advised for yearly  eye exams >> she is UTD - return to clinic in 6 months  2. HL -Reviewed latest lipid panel from 01/2019: LDL high, as are her triglycerides: Lab Results  Component Value Date   CHOL 245 (H) 01/10/2019   HDL 48.50 01/10/2019   LDLCALC 157 (H) 01/10/2019   TRIG 199.0 (H) 01/10/2019   CHOLHDL 5 01/10/2019  - Continues omega-3 fatty acids, but she is intolerant to statins due to previous muscle aches and weakness.  At last visit I suggested Zetia as she feels that she tried it and it caused diarrhea but she is not sure... She would not want to restart it.  3.  Thyroid nodules -Reviewed the report of her latest thyroid ultrasound from 2017: Nodules were all subcentimeter and not worrisome -She denies neck compression symptoms -Latest TSH was reviewed from last visit and this was normal. -No thyroid ultrasounds are indicated unless she starts to develop neck compression symptoms   Philemon Kingdom, MD PhD [facility]

## 2019-07-12 ENCOUNTER — Other Ambulatory Visit: Payer: Self-pay | Admitting: Internal Medicine

## 2019-08-06 NOTE — Progress Notes (Signed)
59 y.o. G1P1 Married White or Caucasian female here for annual exam.  She just saw Dr. Cruzita Lederer last month.  HbA1C was 5.8.  Had blood work done in January.  She is working on more exercise.  She is doing squats as well.  Has not done the Covid vaccination.    Patient's last menstrual period was 01/03/1998.          Sexually active: Yes.    The current method of family planning is status post hysterectomy.    Exercising: Yes.    squats, steps Smoker:  no  Health Maintenance: Pap:  12-25-14 neg History of abnormal Pap:  yes MMG:  01-30-17 category a density birads 1:neg Colonoscopy:  07-15-15 f/u 23yrs BMD:   2012 TDaP:  2013 Pneumonia vaccine(s):  Not done Shingrix:   Not done Hep C testing: neg 2016 Screening Labs: 01/2019   reports that she has never smoked. She has never used smokeless tobacco. She reports that she does not drink alcohol and does not use drugs.  Past Medical History:  Diagnosis Date  . Diabetes ([place])    no medication  . Fatty liver   . High cholesterol    no medication  . History of hiatal hernia   . History of kidney stones   . Hypothyroidism 2017   followed by Dr. Ignacia Felling in [place]  . Migraine    silent  . Nerve pain left elbow following IV  . Nerve pain right thigh  . PONV (postoperative nausea and vomiting)   . 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 Shingles 2018  . Shoulder pain, left   . Sleep apnea     Past Surgical History:  Procedure Laterality Date  . BREAST REDUCTION SURGERY Bilateral 1997  . CARPAL TUNNEL RELEASE    . CERVICAL FUSION     [phone] and C6-7 2005  . COLONOSCOPY    . HEMORRHOID SURGERY  11/2014   thrombosed hemorroid opened in office  . shouder surgery Left 10/26/2017   rotator cuff  . VAGINAL HYSTERECTOMY  2000  . VULVECTOMY Left 03/14/2016   Procedure: LABIAL MASS EXCISION;  Surgeon: Megan Salon, MD;  Location: [facility];  Service: Gynecology;  Laterality: Left;  follow first case    Current Outpatient  Medications  Medication Sig Dispense Refill  . Amylase-Lipase-Protease (PANCREASE PO) Take 3 tablets by mouth 3 (three) times daily with meals.    . Ascorbic Acid (VITAMIN C) 1000 MG tablet 1,000 mg daily.    [name] (BERBERINE COMPLEX PO) Take by mouth daily.    . Cholecalciferol (VITAMIN D3) 5000 units CAPS Take 5,000 Units by mouth every evening.    Marland Kitchen ibuprofen (ADVIL,MOTRIN) 200 MG tablet Take 800 mg by mouth 3 (three) times daily as needed for headache or moderate pain.    . INOSITOL PO Take by mouth.    . Magnesium 400 MG TABS Take 400 mg by mouth.     . metFORMIN (GLUCOPHAGE-XR) 500 MG 24 hr tablet Take 2 tablets (1,000 mg total) by mouth 2 (two) times daily after a meal. 360 tablet 3  . TURMERIC PO Take by mouth daily.    Marland Kitchen UNABLE TO FIND Med Name:  CBD hemp 60ng oil, phytocannbinoids 30mg  as needed    . vitamin B-12 (CYANOCOBALAMIN) 1000 MCG tablet Take by mouth daily.     No current facility-administered medications for this visit.    Family History  Problem Relation Age of Onset  .  Heart Problems Mother   . High blood pressure Mother   . Diabetes Mother   . Dementia Mother   . Gout Father   . Diabetes Father   . Colon cancer Father     Review of Systems  Constitutional: Negative.   HENT: Negative.   Eyes: Negative.   Respiratory: Negative.   Cardiovascular: Negative.   Gastrointestinal: Negative.   Endocrine: Negative.   Genitourinary: Negative.   Musculoskeletal: Negative.   Skin: Negative.   Allergic/Immunologic: Negative.   Neurological: Negative.   Hematological: Negative.   Psychiatric/Behavioral: Negative.     Exam:   BP 120/82   Pulse 70   Resp 16   Wt 244 lb (110.7 kg)   LMP 01/03/1998   BMI 42.88 kg/m      General appearance: alert, cooperative and appears stated age Head: Normocephalic, without obvious abnormality, atraumatic Neck: no adenopathy, supple, symmetrical, trachea midline and thyroid normal to inspection  and palpation Lungs: clear to auscultation bilaterally Breasts: normal appearance, no masses or tenderness Heart: regular rate and rhythm Abdomen: soft, non-tender; bowel sounds normal; no masses,  no organomegaly Extremities: extremities normal, atraumatic, no cyanosis or edema Skin: Skin color, texture, turgor normal. No rashes or lesions Lymph nodes: Cervical, supraclavicular, and axillary nodes normal. No abnormal inguinal nodes palpated Neurologic: Grossly normal   Pelvic: External genitalia:  no lesions              Urethra:  normal appearing urethra with no masses, tenderness or lesions              Bartholins and Skenes: normal                 Vagina: normal appearing vagina with normal color and discharge, no lesions              Cervix: absent              Pap taken: No. Bimanual Exam:  Uterus:  uterus absent              Adnexa: no mass, fullness, tenderness               Rectovaginal: Confirms               Anus:  normal sphincter tone, no lesions  Chaperone, Terence Lux, CMA, was present for exam.  A:  Well Woman with normal exam PMP, no HRT TYpe 2 DM Obesity H/o multipe neck surgeries Rotator cuff repair Complex right ovarian cyst, stable with ultrasound last year  P:   Mammogram overdue.  Pt will schedule. BMD order placed for [facility] pap smear not indicated due to prior TVH 2020 Lab work done 01/2019 and 07/2019 with Dr. Cruzita Lederer We have discussed stable ultrasound findings.  She desires to continue watching and will repeat this next year. Return annually or prn

## 2019-08-09 ENCOUNTER — Encounter: Payer: Self-pay | Admitting: Obstetrics & Gynecology

## 2019-08-09 ENCOUNTER — Other Ambulatory Visit: Payer: Self-pay

## 2019-08-09 ENCOUNTER — Ambulatory Visit: Payer: BC Managed Care – PPO | Admitting: Obstetrics & Gynecology

## 2019-08-09 VITALS — BP 120/82 | HR 70 | Resp 16 | Wt 244.0 lb

## 2019-08-09 DIAGNOSIS — Z01419 Encounter for gynecological examination (general) (routine) without abnormal findings: Secondary | ICD-10-CM | POA: Diagnosis not present

## 2019-08-09 DIAGNOSIS — E2839 Other primary ovarian failure: Secondary | ICD-10-CM | POA: Diagnosis not present

## 2019-08-23 ENCOUNTER — Other Ambulatory Visit: Payer: Self-pay | Admitting: Obstetrics & Gynecology

## 2019-08-23 DIAGNOSIS — Z1231 Encounter for screening mammogram for malignant neoplasm of breast: Secondary | ICD-10-CM

## 2019-08-23 DIAGNOSIS — E2839 Other primary ovarian failure: Secondary | ICD-10-CM

## 2019-12-04 ENCOUNTER — Ambulatory Visit
Admission: RE | Admit: 2019-12-04 | Discharge: 2019-12-04 | Disposition: A | Discharge status: Home / Self Care | Payer: BC Managed Care – PPO | Source: Ambulatory Visit | Attending: Obstetrics & Gynecology | Admitting: Obstetrics & Gynecology

## 2019-12-04 ENCOUNTER — Other Ambulatory Visit: Payer: Self-pay

## 2019-12-04 DIAGNOSIS — E2839 Other primary ovarian failure: Secondary | ICD-10-CM

## 2019-12-04 DIAGNOSIS — Z1231 Encounter for screening mammogram for malignant neoplasm of breast: Secondary | ICD-10-CM

## 2019-12-06 ENCOUNTER — Other Ambulatory Visit: Payer: Self-pay | Admitting: Obstetrics & Gynecology

## 2019-12-06 DIAGNOSIS — R928 Other abnormal and inconclusive findings on diagnostic imaging of breast: Secondary | ICD-10-CM

## 2019-12-11 ENCOUNTER — Ambulatory Visit
Admission: RE | Admit: 2019-12-11 | Discharge: 2019-12-11 | Disposition: A | Discharge status: Home / Self Care | Payer: BC Managed Care – PPO | Source: Ambulatory Visit | Attending: Obstetrics & Gynecology | Admitting: Obstetrics & Gynecology

## 2019-12-11 ENCOUNTER — Other Ambulatory Visit: Payer: Self-pay

## 2019-12-11 DIAGNOSIS — R928 Other abnormal and inconclusive findings on diagnostic imaging of breast: Secondary | ICD-10-CM

## 2020-01-14 ENCOUNTER — Ambulatory Visit: Payer: BC Managed Care – PPO | Admitting: Internal Medicine

## 2020-01-20 ENCOUNTER — Ambulatory Visit: Payer: BC Managed Care – PPO | Admitting: Internal Medicine

## 2020-01-24 ENCOUNTER — Ambulatory Visit: Payer: BC Managed Care – PPO | Admitting: Internal Medicine

## 2020-04-02 ENCOUNTER — Encounter: Payer: Self-pay | Admitting: Internal Medicine

## 2020-04-02 ENCOUNTER — Ambulatory Visit: Payer: BC Managed Care – PPO | Admitting: Internal Medicine

## 2020-04-02 ENCOUNTER — Other Ambulatory Visit: Payer: Self-pay

## 2020-04-02 VITALS — BP 120/70 | HR 88 | Ht 63.25 in | Wt 240.2 lb

## 2020-04-02 DIAGNOSIS — E1165 Type 2 diabetes mellitus with hyperglycemia: Secondary | ICD-10-CM | POA: Diagnosis not present

## 2020-04-02 DIAGNOSIS — E782 Mixed hyperlipidemia: Secondary | ICD-10-CM | POA: Diagnosis not present

## 2020-04-02 DIAGNOSIS — E042 Nontoxic multinodular goiter: Secondary | ICD-10-CM

## 2020-04-02 LAB — POCT GLYCOSYLATED HEMOGLOBIN (HGB A1C): Hemoglobin A1C: 5.9 % — AB (ref 4.0–5.6)

## 2020-04-02 NOTE — Patient Instructions (Addendum)
Please continue: - Metformin ER 1000 mg 2x a day with meals.  Continue to check sugars once a day.  Stop Biotin and come back for labs in ~ 1 week.  Please return in 6 months with your sugar log.

## 2020-04-02 NOTE — Progress Notes (Addendum)
Patient ID: Jade Anderson, female   DOB: 04-28-1960, 60 y.o.   MRN: [id number]   This visit occurred during the SARS-CoV-2 public health emergency.  Safety protocols were in place, including screening questions prior to the visit, additional usage of staff PPE, and extensive cleaning of exam room while observing appropriate contact time as indicated for disinfecting solutions.    HPI: Jade Anderson is a 60 y.o.-year-old female, presenting for follow-up for DM2, dx in 2010 non-insulin-dependent, without long term complications.  Last visit 8 months ago.  Interim history: Pt had major stress since last visit, with her granddaughter dying at 37 years old - opioid overdose. Her sugars have been a little higher than before. She otherwise does not have complaints: She had diarrhea after meals due to inability to digest fats, but she started Amylase and Protease (she continues lipase) >> diarrhea after meals resolved.  Reviewed HbA1c levels: Lab Results  Component Value Date   HGBA1C 5.8 (A) 07/11/2019   HGBA1C 5.7 (A) 01/10/2019   HGBA1C 6.0 (A) 07/10/2018   HGBA1C 5.6 01/08/2018   HGBA1C 5.7 (A) 09/08/2017   HGBA1C 6.6 (H) 03/09/2017   HGBA1C 6.2 (A) 02/23/2016   HGBA1C 6.0 (H) 12/25/2014   HGBA1C 5.7 (H) 12/19/2013  2010: HbA1c 9.4%  She is on: - Metformin ER 500 g 2x a day >> 1000 mg 2x a day -started 05/2018-tolerates this well, initially had diarrhea with this - Berberine - Chromium  Pt checks her sugars 0-1x a day: - am:  101-135 >> 106-137, 156 >> 113-142, 150 >> 111-145 - 2h after b'fast: n/c >> 162 >> n/c >> 130 - before lunch: 89-100 >> 91, 92 >> 95 >> 110-118 >> 94-116 - 2h after lunch: 116, 205 >> 93, 180 >> 117-148 - before dinner:  108-117 >> 90-112 >> 97 >> 105, 110 - 2h after dinner:  160 >> 138-142 >> 101-148 >> 142-160 >> 145 - bedtime:   99, 114 >> 100-131, 156 >> n/c >> 110-138, 153 - nighttime: n/c Lowest sugar was 89 >> 91 >> 90 >> 93; she has  hypoglycemia awareness in the 70s. Highest sugar was 206 >> 160 >> 205 (dessert) >> 182.  She has a history of pancreatitis as a child, due to mumps.  Glucometer: ReliOn  Pt's meals are: - Breakfast: Oatmeal, sausage, bacon, biscuits, hash brown, fruit, peanut butter  (allergic to eggs) - Lunch: Lunch meat, hamburger, hot dog, salads - Dinner: Organic beef or chicken, vegetables, salads, bread - Snacks: 3: Fruit, nuts  -No CKD, last BUN/creatinine:  Lab Results  Component Value Date   BUN 15 01/10/2019   BUN 11 01/08/2018   CREATININE 0.85 01/10/2019   CREATININE 0.78 01/08/2018   -+ HL; last set of lipids: Lab Results  Component Value Date   CHOL 245 (H) 01/10/2019   HDL 48.50 01/10/2019   LDLCALC 157 (H) 01/10/2019   TRIG 199.0 (H) 01/10/2019   CHOLHDL 5 01/10/2019  On omega-3 fatty acids.  She is intolerant to statins: Muscle aches with Lipitor.  She refused to try statins again.  I suggested Zetia at last visit. She tried it and believes that she had diarrhea (cannot remember exactly).  She did not want to retry it. - last eye exam was in 03/2019: No DR, reportedly.  On berberine.  - no numbness and tingling in her feet.  She had hair loss >> TFTs normal but at ULN >> started LT3 >> worsening diabetes  >> stopped  after 1 year.  Latest TSH was normal: Lab Results  Component Value Date   TSH 2.02 01/10/2019   She has a history of thyroid nodules: 0.6 x 0.9 cm bilaterally, per U/S 03/31/2015.  She had a repeat U/S 10/08/2015 ([place]).  She started 2 drops of Iodine 1-2x a week. Takes B complex vitamins - last dose this am.  Pt denies: - feeling nodules in neck - hoarseness - choking - SOB with lying down + Intermittent dysphagia after her C-spine surgery  No family history of thyroid cancer.  Her uncle had a goiter.  Also 2 cousins with Hashimoto's ds.  No history of radiation treatment to head or neck.  She is not on biotin or  herbal supplements.  ROS: Constitutional: no weight gain/no weight loss, no fatigue, no subjective hyperthermia, no subjective hypothermia Eyes: no blurry vision, no xerophthalmia ENT: no sore throat, + see HPI Cardiovascular: no CP/no SOB/no palpitations/no leg swelling Respiratory: no cough/no SOB/no wheezing Gastrointestinal: no N/no V/no D/no C/no acid reflux Musculoskeletal: no muscle aches/no joint aches Skin: no rashes, no hair loss Neurological: no tremors/no numbness/no tingling/no dizziness  I reviewed pt's medications, allergies, PMH, social hx, family hx, and changes were documented in the history of present illness. Otherwise, unchanged from my initial visit note.  Past Medical History:  Diagnosis Date  . Diabetes ([place])    no medication  . Fatty liver   . High cholesterol    no medication  . History of hiatal hernia   . History of kidney stones   . Hypothyroidism 2017   followed by Dr. Ignacia Felling in [place]  . Migraine    silent  . Nerve pain left elbow following IV  . Nerve pain right thigh  . PONV (postoperative nausea and vomiting)   . 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 Shingles 2018  . Shoulder pain, left   . Sleep apnea    Past Surgical History:  Procedure Laterality Date  . BREAST REDUCTION SURGERY Bilateral 1997  . CARPAL TUNNEL RELEASE    . CERVICAL FUSION     [phone] and C6-7 2005  . COLONOSCOPY    . HEMORRHOID SURGERY  11/2014   thrombosed hemorroid opened in office  . REDUCTION MAMMAPLASTY    . shouder surgery Left 10/26/2017   rotator cuff  . VAGINAL HYSTERECTOMY  2000  . VULVECTOMY Left 03/14/2016   Procedure: LABIAL MASS EXCISION;  Surgeon: Megan Salon, MD;  Location: [facility];  Service: Gynecology;  Laterality: Left;  follow first case   Social History   Socioeconomic History  . Marital status: Married    Spouse name: Francee Piccolo  . Number of children: 1  . Years of education: 21  . Highest education level: Not on file   Occupational History  . housewife  Tobacco Use  . Smoking status: Never Smoker  . Smokeless tobacco: Never Used  Substance and Sexual Activity  . Alcohol use: No    Alcohol/week: 0.0 oz  . Drug use: No  . Sexual activity: Yes    Partners: Male    Birth control/protection: Surgical    Comment: TAH  Social History Narrative   Patient lives at home with her husband Francee Piccolo. Patient has one child and a high school education.    Patient is right-handed.  Patient drinks one cup of tea daily and occasionally a diet soda.   Current Outpatient Medications on File Prior to Visit  Medication Sig Dispense  Refill  . Amylase-Lipase-Protease (PANCREASE PO) Take 3 tablets by mouth 3 (three) times daily with meals.    . Ascorbic Acid (VITAMIN C) 1000 MG tablet 1,000 mg daily.    [name] (BERBERINE COMPLEX PO) Take by mouth daily.    . Cholecalciferol (VITAMIN D3) 5000 units CAPS Take 5,000 Units by mouth every evening.    Marland Kitchen ibuprofen (ADVIL,MOTRIN) 200 MG tablet Take 800 mg by mouth 3 (three) times daily as needed for headache or moderate pain.    . INOSITOL PO Take by mouth.    . Magnesium 400 MG TABS Take 400 mg by mouth.     . metFORMIN (GLUCOPHAGE-XR) 500 MG 24 hr tablet Take 2 tablets (1,000 mg total) by mouth 2 (two) times daily after a meal. 360 tablet 3  . TURMERIC PO Take by mouth daily.    Marland Kitchen UNABLE TO FIND Med Name:  CBD hemp 60ng oil, phytocannbinoids 30mg  as needed    . vitamin B-12 (CYANOCOBALAMIN) 1000 MCG tablet Take by mouth daily.     No current facility-administered medications on file prior to visit.   Allergies  Allergen Reactions  . Eggs Or Egg-Derived Products     Upset GI  . Hydrocodone Nausea And Vomiting  . Metoclopramide     Anxiety  . Other Nausea And Vomiting and Diarrhea    ENTEX, Ragweed  . Macrobid [Nitrofurantoin Monohyd Macro] Rash   Family History  Problem Relation Age of Onset  . Heart Problems Mother   . High blood pressure  Mother   . Diabetes Mother   . Dementia Mother   . Gout Father   . Diabetes Father   . Colon cancer Father    Pt has FH of DM in M, F, brothers.  PE: BP 120/70 (BP Location: Left Arm, Patient Position: Sitting, Cuff Size: Normal)   Pulse 88   Ht 5' 3.25" (1.607 m)   Wt 240 lb 3.2 oz (109 kg)   LMP 01/03/1998   SpO2 98%   BMI 42.21 kg/m  Wt Readings from Last 3 Encounters:  04/02/20 240 lb 3.2 oz (109 kg)  08/09/19 244 lb (110.7 kg)  07/11/19 244 lb (110.7 kg)   Constitutional: overweight, in NAD Eyes: PERRLA, EOMI, no exophthalmos ENT: moist mucous membranes, no thyromegaly, no cervical lymphadenopathy Cardiovascular: RRR, No MRG Respiratory: CTA B Gastrointestinal: abdomen soft, NT, ND, BS+ Musculoskeletal: no deformities, strength intact in all 4 Skin: moist, warm, no rashes Neurological: no tremor with outstretched hands, DTR normal in all 4  ASSESSMENT: 1. DM2, non-insulin-dependent, becoming uncontrolled, without long term complications, but with hyperglycemia  2. HL  3.  Thyroid nodules  PLAN:  1. Patient with longstanding, previously diet-controlled diabetes, with worsening control after relaxing her diet, after which we started Metformin.  She did not tolerate regular Metformin due to diarrhea but she is tolerating the ER Metformin well.  Her HbA1c improved afterwards so we did not have to use SGLT2 inhibitors and GLP-1 receptor agonists.  She does have a history of pancreatitis, however, this was very distant, as a child, due to mumps.  At last visit, sugars were mostly at goal with only some slightly higher blood sugars in the morning but not enough to trigger a change in her regimen.  HbA1c was excellent, at 5.8%.  We continued Metformin ER. -At this visit, sugars remain mostly at goal with very few mildly hyperglycemic exceptions.  She feels that her sugars improved after her diarrhea improved.  We do not need to change her regimen for now. - I suggested to:   Patient Instructions  Please continue: - Metformin ER 1000 mg 2x a day with meals.  Continue to check sugars once a day.  Please return in 6 months with your sugar log.   - we checked her HbA1c: 5.9% (slightly higher) - advised to check sugars at different times of the day - 1x a day, rotating check times - advised for yearly eye exams >> she needs annual appointments-advised to schedule - we will check her annual labs when she returns in a week. - return to clinic in 6 months  2. HL -Reviewed latest lipid panel from 01/2019: LDL high, as are her triglycerides Lab Results  Component Value Date   CHOL 245 (H) 01/10/2019   HDL 48.50 01/10/2019   LDLCALC 157 (H) 01/10/2019   TRIG 199.0 (H) 01/10/2019   CHOLHDL 5 01/10/2019  -She continues omega-3 fatty acids but she is intolerant to statins due to previous muscle aches and weakness.  I suggested Zetia but she felt that this caused diarrhea in the past.  She refused to try it.  3.  Thyroid nodules -Reviewed the report of her latest thyroid ultrasound from 2017: Nodules were all subcentimeter and not worrisome -No neck compression symptoms -Latest TSH level was normal and I plan to repeat another one today but she is on a B complex and also started iodine drops.  We will stop both and check tests in 1 week -There is no need to repeat a thyroid ultrasound unless she starts to develop neck compression symptoms  Component     Latest Ref Rng & Units 04/10/2020  Sodium     135 - 145 mEq/L 138  Potassium     3.5 - 5.1 mEq/L 4.3  Chloride     96 - 112 mEq/L 103  CO2     19 - 32 mEq/L 27  Glucose     70 - 99 mg/dL 107 (H)  BUN     6 - 23 mg/dL 13  Creatinine     0.40 - 1.20 mg/dL 0.80  Total Bilirubin     0.2 - 1.2 mg/dL 0.5  Alkaline Phosphatase     39 - 117 U/L 61  AST     0 - 37 U/L 34  ALT     0 - 35 U/L 51 (H)  Total Protein     6.0 - 8.3 g/dL 6.9  Albumin     3.5 - 5.2 g/dL 4.1  GFR     >60.00 mL/min 80.43   Calcium     8.4 - 10.5 mg/dL 9.6  Cholesterol     0 - 200 mg/dL 203 (H)  Triglycerides     0.0 - 149.0 mg/dL 101.0  HDL Cholesterol     >39.00 mg/dL 48.80  VLDL     0.0 - 40.0 mg/dL 20.2  LDL (calc)     0 - 99 mg/dL 134 (H)  Total CHOL/HDL Ratio      4  NonHDL      153.79  Microalb, Ur     0.0 - 1.9 mg/dL 0.8  Creatinine,U     mg/dL 80.8  MICROALB/CREAT RATIO     0.0 - 30.0 mg/g 1.0  TSH     0.35 - 4.50 uIU/mL 2.19  Triiodothyronine,Free,Serum     2.3 - 4.2 pg/mL 3.1  T4,Free(Direct)     0.60 - 1.60 ng/dL 0.96  TFTs  are normal. ACR normal. Kidney function normal. LDL still above target slightly better.  Philemon Kingdom, MD PhD [facility]

## 2020-04-10 ENCOUNTER — Other Ambulatory Visit: Payer: Self-pay

## 2020-04-10 ENCOUNTER — Other Ambulatory Visit (INDEPENDENT_AMBULATORY_CARE_PROVIDER_SITE_OTHER): Payer: BC Managed Care – PPO

## 2020-04-10 DIAGNOSIS — E1165 Type 2 diabetes mellitus with hyperglycemia: Secondary | ICD-10-CM

## 2020-04-10 DIAGNOSIS — E782 Mixed hyperlipidemia: Secondary | ICD-10-CM | POA: Diagnosis not present

## 2020-04-10 DIAGNOSIS — E042 Nontoxic multinodular goiter: Secondary | ICD-10-CM

## 2020-04-10 LAB — COMPREHENSIVE METABOLIC PANEL
ALT: 51 U/L — ABNORMAL HIGH (ref 0–35)
AST: 34 U/L (ref 0–37)
Albumin: 4.1 g/dL (ref 3.5–5.2)
Alkaline Phosphatase: 61 U/L (ref 39–117)
BUN: 13 mg/dL (ref 6–23)
CO2: 27 mEq/L (ref 19–32)
Calcium: 9.6 mg/dL (ref 8.4–10.5)
Chloride: 103 mEq/L (ref 96–112)
Creatinine, Ser: 0.8 mg/dL (ref 0.40–1.20)
GFR: 80.43 mL/min (ref >60.00)
Glucose, Bld: 107 mg/dL — ABNORMAL HIGH (ref 70–99)
Potassium: 4.3 mEq/L (ref 3.5–5.1)
Sodium: 138 mEq/L (ref 135–145)
Total Bilirubin: 0.5 mg/dL (ref 0.2–1.2)
Total Protein: 6.9 g/dL (ref 6.0–8.3)

## 2020-04-10 LAB — LIPID PANEL
Cholesterol: 203 mg/dL — ABNORMAL HIGH (ref 0–200)
HDL: 48.8 mg/dL (ref >39.00)
LDL Cholesterol: 134 mg/dL — ABNORMAL HIGH (ref 0–99)
NonHDL: 153.79
Total CHOL/HDL Ratio: 4
Triglycerides: 101 mg/dL (ref 0.0–149.0)
VLDL: 20.2 mg/dL (ref 0.0–40.0)

## 2020-04-10 LAB — MICROALBUMIN / CREATININE URINE RATIO
Creatinine,U: 80.8 mg/dL
Microalb Creat Ratio: 1 mg/g (ref 0.0–30.0)
Microalb, Ur: 0.8 mg/dL (ref 0.0–1.9)

## 2020-04-10 LAB — T3, FREE: T3, Free: 3.1 pg/mL (ref 2.3–4.2)

## 2020-04-10 LAB — TSH: TSH: 2.19 u[IU]/mL (ref 0.35–4.50)

## 2020-04-10 LAB — T4, FREE: Free T4: 0.96 ng/dL (ref 0.60–1.60)

## 2020-04-13 ENCOUNTER — Telehealth: Payer: Self-pay

## 2020-04-13 NOTE — Telephone Encounter (Signed)
-----   Message from Philemon Kingdom, MD sent at 04/10/2020  3:18 PM EDT ----- Can you please call pt.:  Thyroid test are normal. Urinary proteins normal. Kidney function normal. LDL cholesterol is still above target, but slightly better.  If she wants to try ezetimibe (she refused this in the past), we can send a 90-day supply to her pharmacy. One of the liver test, ALT, if still elevated, slightly higher than last year.  She is seeing GI.

## 2020-04-13 NOTE — Telephone Encounter (Signed)
Pt advised of results. 

## 2020-07-23 ENCOUNTER — Other Ambulatory Visit: Payer: Self-pay | Admitting: Internal Medicine

## 2020-08-11 ENCOUNTER — Ambulatory Visit (HOSPITAL_BASED_OUTPATIENT_CLINIC_OR_DEPARTMENT_OTHER): Payer: BC Managed Care – PPO | Admitting: Obstetrics & Gynecology

## 2020-08-24 ENCOUNTER — Ambulatory Visit (HOSPITAL_BASED_OUTPATIENT_CLINIC_OR_DEPARTMENT_OTHER): Payer: BC Managed Care – PPO | Admitting: Obstetrics & Gynecology

## 2020-09-30 ENCOUNTER — Encounter (HOSPITAL_BASED_OUTPATIENT_CLINIC_OR_DEPARTMENT_OTHER): Payer: Self-pay | Admitting: Obstetrics & Gynecology

## 2020-09-30 ENCOUNTER — Ambulatory Visit (INDEPENDENT_AMBULATORY_CARE_PROVIDER_SITE_OTHER): Payer: BC Managed Care – PPO | Admitting: Obstetrics & Gynecology

## 2020-09-30 ENCOUNTER — Other Ambulatory Visit: Payer: Self-pay

## 2020-09-30 VITALS — BP 141/72 | HR 90 | Ht 63.0 in | Wt 237.2 lb

## 2020-09-30 DIAGNOSIS — N83201 Unspecified ovarian cyst, right side: Secondary | ICD-10-CM

## 2020-09-30 DIAGNOSIS — Z6841 Body Mass Index (BMI) 40.0 and over, adult: Secondary | ICD-10-CM

## 2020-09-30 DIAGNOSIS — Z01419 Encounter for gynecological examination (general) (routine) without abnormal findings: Secondary | ICD-10-CM

## 2020-09-30 DIAGNOSIS — Z90710 Acquired absence of both cervix and uterus: Secondary | ICD-10-CM

## 2020-09-30 DIAGNOSIS — Z780 Asymptomatic menopausal state: Secondary | ICD-10-CM | POA: Diagnosis not present

## 2020-09-30 NOTE — Progress Notes (Signed)
60 y.o. G1P1 Married White or Caucasian female here for annual exam.  Reports her oldest grandchild died of a fentanyl overdose.  They are still in [place].  Pt and her husband have been to [place] a lot more this year.  Coping with this sadness.    Denies vaginal bleeding.    Patient's last menstrual period was 01/03/1998.          Sexually active: Yes.    The current method of family planning is status post hysterectomy.    Exercising: Yes.     walking Smoker:  no  Health Maintenance: Pap:  12/25/2014 Negative History of abnormal Pap:  remote hx MMG:  12/04/2019 Diagnostic recommended.  Follow up was fine. Colonoscopy:  07/15/2015, follow up 5 years.  Pt knows this is due and states she will get it scheduled BMD:   normal 2021.  Discussed follow up. TDaP:  2013   reports that she has never smoked. She has never used smokeless tobacco. She reports that she does not drink alcohol and does not use drugs.  Past Medical History:  Diagnosis Date   Diabetes ([place])    no medication   Fatty liver    High cholesterol    no medication   History of hiatal hernia    History of kidney stones    Hypothyroidism 2017   followed by Dr. Ignacia Felling in [place]   Migraine    silent   Nerve pain left elbow following IV   Nerve pain right thigh   PONV (postoperative nausea and vomiting)    Ruptured disc, cervical 07/2013   C7-T1   Shingles 2018   Shoulder pain, left    Sleep apnea     Past Surgical History:  Procedure Laterality Date   BREAST REDUCTION SURGERY Bilateral 1997   CARPAL TUNNEL RELEASE     CERVICAL FUSION     [phone] and C6-7 2005   COLONOSCOPY     HEMORRHOID SURGERY  11/2014   thrombosed hemorroid opened in office   REDUCTION MAMMAPLASTY     shouder surgery Left 10/26/2017   rotator cuff   VAGINAL HYSTERECTOMY  2000   VULVECTOMY Left 03/14/2016   Procedure: LABIAL MASS EXCISION;  Surgeon: Megan Salon, MD;  Location: [facility];  Service: Gynecology;  Laterality: Left;   follow first case    Current Outpatient Medications  Medication Sig Dispense Refill   Amylase-Lipase-Protease (PANCREASE PO) Take 3 tablets by mouth 3 (three) times daily with meals.     Ascorbic Acid (VITAMIN C) 1000 MG tablet 1,000 mg daily.     Barberry-Oreg Grape-Goldenseal (BERBERINE COMPLEX PO) Take by mouth daily.     Cholecalciferol (VITAMIN D3) 5000 units CAPS Take 5,000 Units by mouth every evening.     ibuprofen (ADVIL,MOTRIN) 200 MG tablet Take 800 mg by mouth 3 (three) times daily as needed for headache or moderate pain.     INOSITOL PO Take by mouth.     Magnesium 400 MG TABS Take 400 mg by mouth.      metFORMIN (GLUCOPHAGE-XR) 500 MG 24 hr tablet TAKE 2 TABLETS(1000 MG) BY MOUTH TWICE DAILY AFTER A MEAL 360 tablet 3   TURMERIC PO Take by mouth daily.     UNABLE TO FIND Med Name:  CBD hemp 60ng oil, phytocannbinoids 30mg  as needed     vitamin B-12 (CYANOCOBALAMIN) 1000 MCG tablet Take by mouth daily.     No current facility-administered medications for this visit.    Family History  Problem Relation Age of Onset   Heart Problems Mother    High blood pressure Mother    Diabetes Mother    Dementia Mother    Gout Father    Diabetes Father    Colon cancer Father     Review of Systems  All other systems reviewed and are negative.  Exam:   BP (!) 141/72 (BP Location: Left Arm, Patient Position: Sitting, Cuff Size: Large)   Pulse 90   Ht 5\' 3"  (1.6 m) Comment: reported  Wt 237 lb 3.2 oz (107.6 kg)   LMP 01/03/1998   BMI 42.02 kg/m   Height: 5\' 3"  (160 cm) (reported)  General appearance: alert, cooperative and appears stated age Head: Normocephalic, without obvious abnormality, atraumatic Neck: no adenopathy, supple, symmetrical, trachea midline and thyroid normal to inspection and palpation Lungs: clear to auscultation bilaterally Breasts: normal appearance, no masses or tenderness Heart: regular rate and rhythm Abdomen: soft, non-tender; bowel sounds normal;  no masses,  no organomegaly Extremities: extremities normal, atraumatic, no cyanosis or edema Skin: Skin color, texture, turgor normal. No rashes or lesions Lymph nodes: Cervical, supraclavicular, and axillary nodes normal. No abnormal inguinal nodes palpated Neurologic: Grossly normal   Pelvic: External genitalia:  no lesions              Urethra:  normal appearing urethra with no masses, tenderness or lesions              Bartholins and Skenes: normal                 Vagina: normal appearing vagina with normal color and no discharge, no lesions              Cervix: absent              Pap taken: No. Bimanual Exam:  Uterus:  uterus absent              Adnexa: no mass, fullness, tenderness               Rectovaginal: Confirms               Anus:  normal sphincter tone, no lesions  Chaperone, Octaviano Batty, CMA, was present for exam.  Assessment/Plan: 1. Well woman exam with routine gynecological exam - pap smear not indicated - MMG 12/04/2019 with negative follow up - colonoscopy 07/2015.  Follow up 5 years.  Pt aware.  States she will schedule. - BMD 2021 - vaccines reviewed/updated, care gaps reviewed  2. Postmenopausal - no HRT  3. H/O vaginal hysterectomy  4. Right ovarian cyst - pt desires follow up this year.  Would like do to closer to home.  Will see if can get scheduled  5. BMI 40.0-44.9, adult ([place])

## 2020-10-01 DIAGNOSIS — Z90710 Acquired absence of both cervix and uterus: Secondary | ICD-10-CM | POA: Insufficient documentation

## 2020-10-01 DIAGNOSIS — Z780 Asymptomatic menopausal state: Secondary | ICD-10-CM | POA: Insufficient documentation

## 2020-10-01 DIAGNOSIS — N83201 Unspecified ovarian cyst, right side: Secondary | ICD-10-CM | POA: Insufficient documentation

## 2020-10-08 ENCOUNTER — Ambulatory Visit: Payer: BC Managed Care – PPO | Admitting: Internal Medicine

## 2020-10-09 ENCOUNTER — Telehealth (HOSPITAL_BASED_OUTPATIENT_CLINIC_OR_DEPARTMENT_OTHER): Payer: Self-pay | Admitting: Obstetrics & Gynecology

## 2020-10-09 NOTE — Telephone Encounter (Signed)
Called patient and left message to call the office back and if calling after hours please leave the  location name where she would like to have the ultrasound on voice mail.

## 2020-10-12 ENCOUNTER — Encounter: Payer: Self-pay | Admitting: Internal Medicine

## 2020-10-12 ENCOUNTER — Other Ambulatory Visit: Payer: Self-pay

## 2020-10-12 ENCOUNTER — Ambulatory Visit (INDEPENDENT_AMBULATORY_CARE_PROVIDER_SITE_OTHER): Payer: BC Managed Care – PPO | Admitting: Internal Medicine

## 2020-10-12 VITALS — BP 128/76 | HR 91 | Ht 63.0 in | Wt 235.2 lb

## 2020-10-12 DIAGNOSIS — E782 Mixed hyperlipidemia: Secondary | ICD-10-CM

## 2020-10-12 DIAGNOSIS — E1165 Type 2 diabetes mellitus with hyperglycemia: Secondary | ICD-10-CM

## 2020-10-12 DIAGNOSIS — E042 Nontoxic multinodular goiter: Secondary | ICD-10-CM | POA: Diagnosis not present

## 2020-10-12 LAB — POCT GLYCOSYLATED HEMOGLOBIN (HGB A1C): Hemoglobin A1C: 5.8 % — AB (ref 4.0–5.6)

## 2020-10-12 NOTE — Progress Notes (Signed)
Patient ID: Jade Anderson, female   DOB: 1960-08-29, 60 y.o.   MRN: [id number]   This visit occurred during the SARS-CoV-2 public health emergency.  Safety protocols were in place, including screening questions prior to the visit, additional usage of staff PPE, and extensive cleaning of exam room while observing appropriate contact time as indicated for disinfecting solutions.    HPI: Jade Anderson is a 60 y.o.-year-old female, presenting for follow-up for DM2, dx in 2010 non-insulin-dependent, without long term complications.  Last visit 8 months ago.  Interim history: Pt had major stress before last visit, with her granddaughter dying at 63 years old - opioid overdose. Before last visit she had diarrhea after meals due to inability to digest fats but she started amylase and protease and continued lipase supplements and diarrhea resolved. No increased urination, blurry vision, nausea, chest pain.  Reviewed HbA1c levels: Lab Results  Component Value Date   HGBA1C 5.9 (A) 04/02/2020   HGBA1C 5.8 (A) 07/11/2019   HGBA1C 5.7 (A) 01/10/2019   HGBA1C 6.0 (A) 07/10/2018   HGBA1C 5.6 01/08/2018   HGBA1C 5.7 (A) 09/08/2017   HGBA1C 6.6 (H) 03/09/2017   HGBA1C 6.2 (A) 02/23/2016   HGBA1C 6.0 (H) 12/25/2014   HGBA1C 5.7 (H) 12/19/2013  2010: HbA1c 9.4%  She is on: - Metformin ER 500 g 2x a day >> 1000 mg 2x a day -started 05/2018-tolerates this well, initially had diarrhea with this - Berberine - Chromium  Pt checks her sugars 0-1x a day: - am:  106-137, 156 >> 113-142, 150 >> 111-145 >> 122-130 - 2h after b'fast: n/c >> 162 >> n/c >> 130 >> n/c - before lunch: 89-100 >> 91, 92 >> 95 >> 110-118 >> 94-116 >> 98, 101 - 2h after lunch: 116, 205 >> 93, 180 >> 117-148 >>  82,140, 146 - before dinner:  108-117 >> 90-112 >> 97 >> 105, 110 >> n/c - 2h after dinner:  138-142 >> 101-148 >> 142-160 >> 145 >> 138-164, 191 - bedtime:   100-131, 156 >> n/c >> 110-138, 153 >> 93-136 -  nighttime: n/c Lowest sugar was 90 >> 93 >> 82; she has hypoglycemia awareness in the 70s. Highest sugar was 205 (dessert) >> 182 >> 191.  She has a history of pancreatitis as a child, due to mumps.  Glucometer: ReliOn  Pt's meals are: - Breakfast: Oatmeal, sausage, bacon, biscuits, hash brown, fruit, peanut butter  (allergic to eggs) - Lunch: Lunch meat, hamburger, hot dog, salads - Dinner: Organic beef or chicken, vegetables, salads, bread - Snacks: 3: Fruit, nuts  -No CKD, last BUN/creatinine:  Lab Results  Component Value Date   BUN 13 04/10/2020   BUN 15 01/10/2019   CREATININE 0.80 04/10/2020   CREATININE 0.85 01/10/2019   -+ HL; last set of lipids: Lab Results  Component Value Date   CHOL 203 (H) 04/10/2020   HDL 48.80 04/10/2020   LDLCALC 134 (H) 04/10/2020   TRIG 101.0 04/10/2020   CHOLHDL 4 04/10/2020  On omega-3 fatty acids.  She is intolerant to statins: Muscle aches with Lipitor.  She refused to try statins again.  I suggested Zetia at last visit. She tried it and believes that she had diarrhea (cannot remember exactly).  She did not want to retry it.  - last eye exam was in 03/2019: No DR, reportedly.  On berberine.   - no numbness and tingling in her feet.   She had hair loss >> TFTs normal but at  ULN >> started LT3 >> worsening diabetes  >> stopped after 1 year.  Latest TSH was normal: Lab Results  Component Value Date   TSH 2.19 04/10/2020   She has a history of thyroid nodules: 0.6 x 0.9 cm bilaterally, per U/S 03/31/2015.  She had a repeat U/S 10/08/2015 ([place]).  She started 2 drops of Iodine 1-2x a week. Takes B complex vitamins - last dose this am.  Pt denies: - feeling nodules in neck - hoarseness - choking - SOB with lying down + Intermittent dysphagia after her C-spine surgery  No family history of thyroid cancer.  Her uncle had a goiter.  Also 2 cousins with Hashimoto's ds.  No history of radiation  treatment to head or neck.  She is not on biotin or herbal supplements.  ROS: + see HPI + Hand tremor after rotator cuff surgery - L shoulder.  I reviewed pt's medications, allergies, PMH, social hx, family hx, and changes were documented in the history of present illness. Otherwise, unchanged from my initial visit note.  Past Medical History:  Diagnosis Date   Diabetes ([place])    no medication   Fatty liver    High cholesterol    no medication   History of hiatal hernia    History of kidney stones    Hypothyroidism 2017   followed by Dr. 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   C7-T1   Shingles 2018   Shoulder pain, left    Sleep apnea    Past Surgical History:  Procedure Laterality Date   BREAST REDUCTION SURGERY Bilateral 1997   CARPAL TUNNEL RELEASE     CERVICAL FUSION     [phone] and C6-7 2005   COLONOSCOPY     HEMORRHOID SURGERY  11/2014   thrombosed hemorroid opened in office   REDUCTION MAMMAPLASTY     shouder surgery Left 10/26/2017   rotator cuff   VAGINAL HYSTERECTOMY  2000   VULVECTOMY Left 03/14/2016   Procedure: LABIAL MASS EXCISION;  Surgeon: Megan Salon, MD;  Location: [facility];  Service: Gynecology;  Laterality: Left;  follow first case   Social History   Socioeconomic History   Marital status: Married    Spouse name: Jade Anderson   Number of children: 1   Years of education: 12   Highest education level: Not on file  Occupational History   housewife  Tobacco Use   Smoking status: Never Smoker   Smokeless tobacco: Never Used  Substance and Sexual Activity   Alcohol use: No    Alcohol/week: 0.0 oz   Drug use: No   Sexual activity: Yes    Partners: Male    Birth control/protection: Surgical    Comment: TAH  Social History Narrative   Patient lives at home with her husband Jade Anderson. Patient has one child and  a high school education.    Patient is right-handed.  Patient drinks one cup of tea daily and occasionally a diet soda.   Current Outpatient Medications on File Prior to Visit  Medication Sig Dispense Refill   Amylase-Lipase-Protease (PANCREASE PO) Take 3 tablets by mouth 3 (three) times daily with meals.     Ascorbic Acid (VITAMIN C) 1000 MG tablet 1,000 mg daily.     Barberry-Oreg Grape-Goldenseal (BERBERINE COMPLEX PO) Take by  mouth daily.     Cholecalciferol (VITAMIN D3) 5000 units CAPS Take 5,000 Units by mouth every evening.     ibuprofen (ADVIL,MOTRIN) 200 MG tablet Take 800 mg by mouth 3 (three) times daily as needed for headache or moderate pain.     INOSITOL PO Take by mouth.     Magnesium 400 MG TABS Take 400 mg by mouth.      metFORMIN (GLUCOPHAGE-XR) 500 MG 24 hr tablet TAKE 2 TABLETS(1000 MG) BY MOUTH TWICE DAILY AFTER A MEAL 360 tablet 3   TURMERIC PO Take by mouth daily.     UNABLE TO FIND Med Name:  CBD hemp 60ng oil, phytocannbinoids 30mg  as needed     vitamin B-12 (CYANOCOBALAMIN) 1000 MCG tablet Take by mouth daily.     No current facility-administered medications on file prior to visit.   Allergies  Allergen Reactions   Eggs Or Egg-Derived Products     Upset GI   Hydrocodone Nausea And Vomiting   Metoclopramide     Anxiety   Other Nausea And Vomiting and Diarrhea    ENTEX, Ragweed   Macrobid [Nitrofurantoin Monohyd Macro] Rash   Family History  Problem Relation Age of Onset   Heart Problems Mother    High blood pressure Mother    Diabetes Mother    Dementia Mother    Gout Father    Diabetes Father    Colon cancer Father    Pt has FH of DM in [place], [place], brothers.  PE: BP 128/76 (BP Location: Right Arm, Patient Position: Sitting, Cuff Size: Normal)   Pulse 91   Ht 5\' 3"  (1.6 m)   Wt 235 lb 3.2 oz (106.7 kg)   LMP 01/03/1998   SpO2 98%   BMI 41.66 kg/m  Wt Readings from Last 3 Encounters:  10/12/20 235 lb 3.2 oz (106.7 kg)  09/30/20 237 lb 3.2 oz  (107.6 kg)  04/02/20 240 lb 3.2 oz (109 kg)   Constitutional: overweight, in NAD Eyes: PERRLA, EOMI, no exophthalmos ENT: moist mucous membranes, no thyromegaly, no cervical lymphadenopathy Cardiovascular: RRR, No MRG Respiratory: CTA B Gastrointestinal: abdomen soft, NT, ND, BS+ Musculoskeletal: no deformities, strength intact in all 4 Skin: moist, warm, no rashes Neurological: +L>R  tremor with outstretched hands, DTR normal in all 4  ASSESSMENT: 1. DM2, non-insulin-dependent, controlled, without long term complications, but with hyperglycemia  2. HL  3.  Thyroid nodules  PLAN:  1. Patient with longstanding, previously diet-controlled diabetes, with worsening control after relaxing her diet, after which we had to start metformin.  She did not tolerate regular metformin due to diarrhea but she is tolerating the ER formulation well.  HbA1c improved afterwards we did not have to escalate her regimen.  Latest HbA1c was excellent, at 5.9%, in 03/2020.  We did not change her regimen at that time.  Sugars are mostly at goal with very few mildly hyperglycemic exceptions.  She felt that her sugars improved after diarrhea improved. -At today's visit, sugars are still mostly at goal, with few hyperglycemic exceptions after dietary indiscretions and 1 higher blood sugar, in the 190s, when she was sick and having fever.  She tolerates metformin well.  No diarrhea from it.  For now, we will continue the current regimen. - I suggested to:  Patient Instructions  Please continue: - Metformin ER 1000 mg 2x a day with meals.  Continue to check sugars once a day.  Please return in 6 months with your sugar log.   -  we checked her HbA1c: 5.8% (slightly lower) - advised to check sugars at different times of the day - 1x a day, rotating check times - advised for yearly eye exams >> she is not UTD and we discussed that she needs another exam. - return to clinic in 6 months  2. HL -Reviewed latest  lipid panel from 04/2020: LDL above target, the rest of fractions at goal: Lab Results  Component Value Date   CHOL 203 (H) 04/10/2020   HDL 48.80 04/10/2020   LDLCALC 134 (H) 04/10/2020   TRIG 101.0 04/10/2020   CHOLHDL 4 04/10/2020  -She continues on omega-3 fatty acids but she is intolerant to statins due to previous muscle aches and weakness.  I suggested today that she feels that this caused diarrhea in the past and refused to try it.  At this visit, we discussed about the possibility of trying Nexletol but she would not want to do so.  3.  Thyroid nodules -Per review of the latest thyroid ultrasound report from 2017, nodules were all subcentimeter and not worrisome -She denies neck compression symptoms -TSH was normal at last check 04/2020 -There is no need to repeat a thyroid ultrasound now that she started to develop neck compression symptoms  Philemon Kingdom, MD PhD [facility] Endocrinology

## 2020-10-12 NOTE — Patient Instructions (Signed)
Please continue: - Metformin ER 1000 mg 2x a day with meals.  Continue to check sugars once a day.  Please return in 6 months with your sugar log.  

## 2020-10-20 ENCOUNTER — Other Ambulatory Visit (HOSPITAL_BASED_OUTPATIENT_CLINIC_OR_DEPARTMENT_OTHER): Payer: Self-pay | Admitting: Obstetrics & Gynecology

## 2020-10-20 DIAGNOSIS — N83201 Unspecified ovarian cyst, right side: Secondary | ICD-10-CM

## 2020-10-20 NOTE — Telephone Encounter (Signed)
Pt called back to let us know that she would like u/s done at [facility].

## 2021-03-28 IMAGING — MG MM DIGITAL DIAGNOSTIC UNILAT*R* W/ TOMO W/ CAD
4 series · 4 of 12 positions shown · non-contrast
Comparison: Previous exam(s).

ACR Breast Density Category a: The breast tissue is almost entirely
fatty.

CLINICAL DATA: Screening recall for a possible asymmetry in the
right breast.

EXAM:
DIGITAL DIAGNOSTIC RIGHT MAMMOGRAM WITH CAD AND TOMO
ULTRASOUND RIGHT BREAST

[R CC synth-2D]
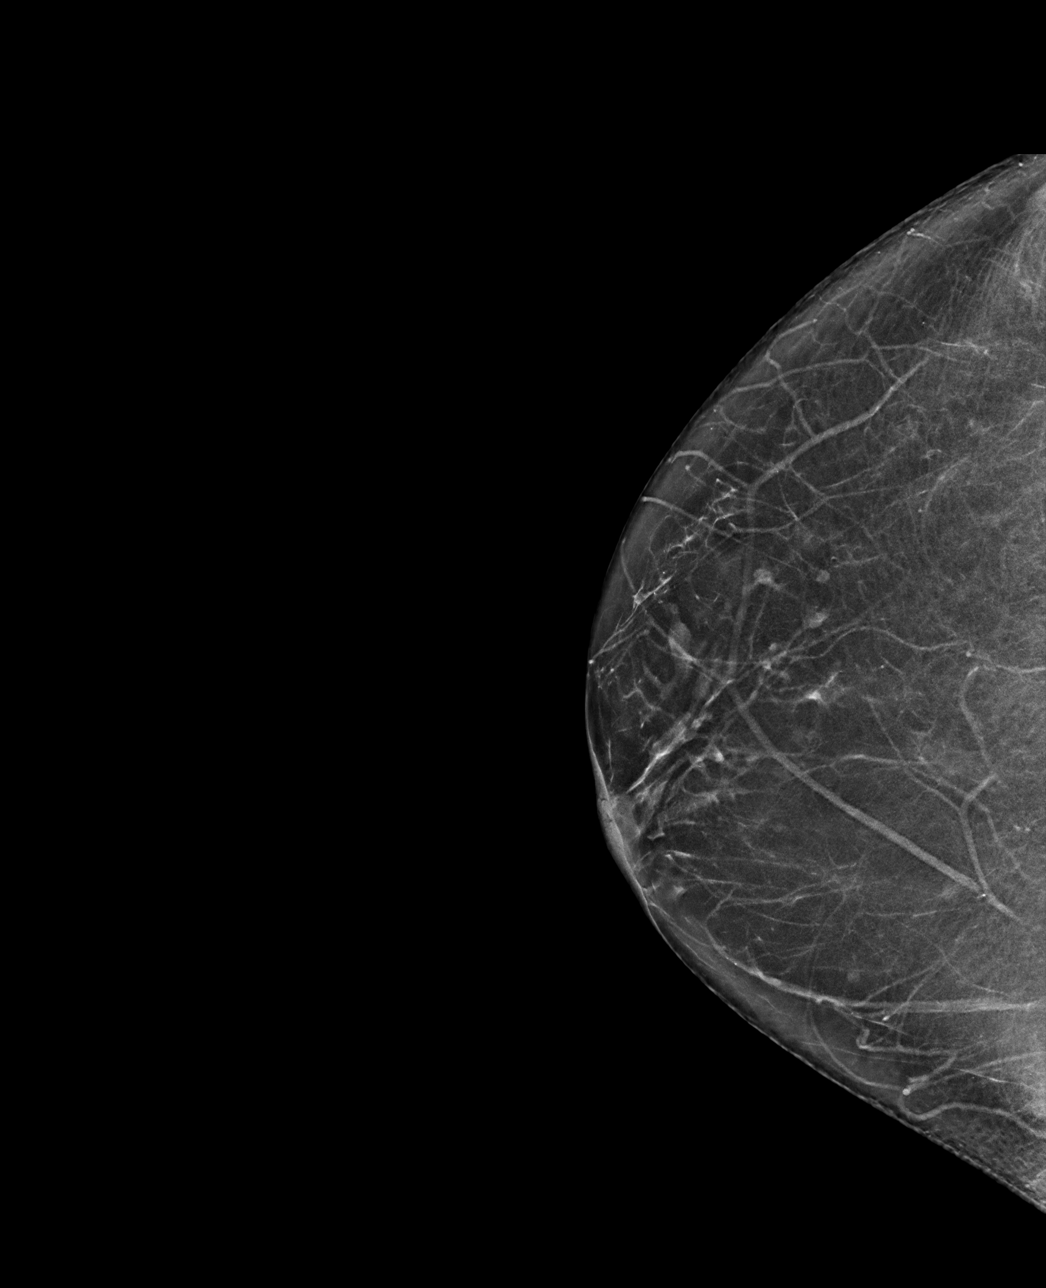

[R MLO synth-2D]
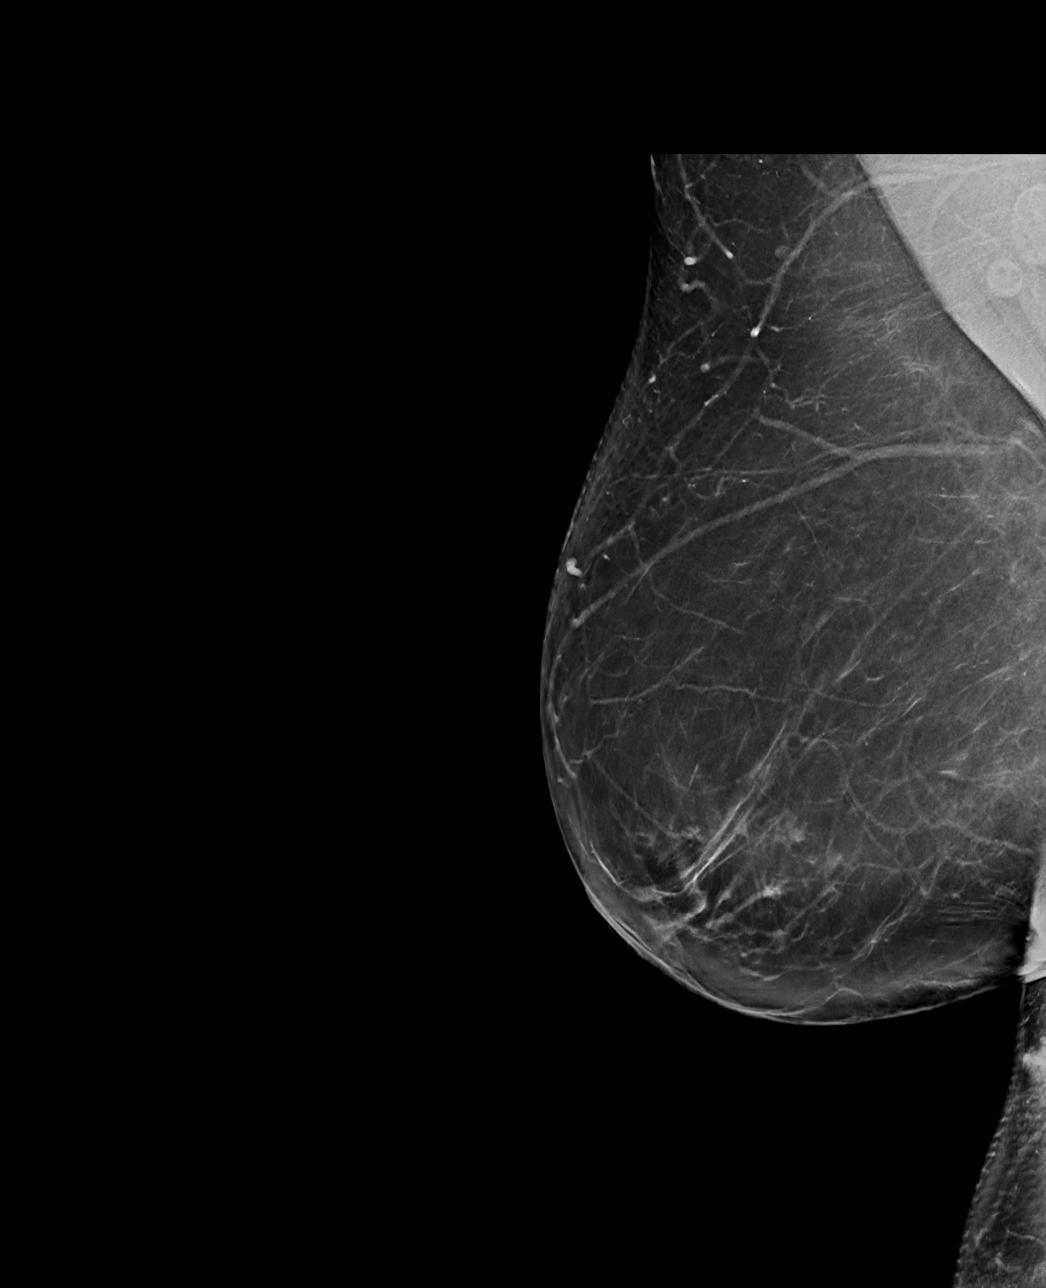

[R MLO tomo · tomo slice 45/89.0]
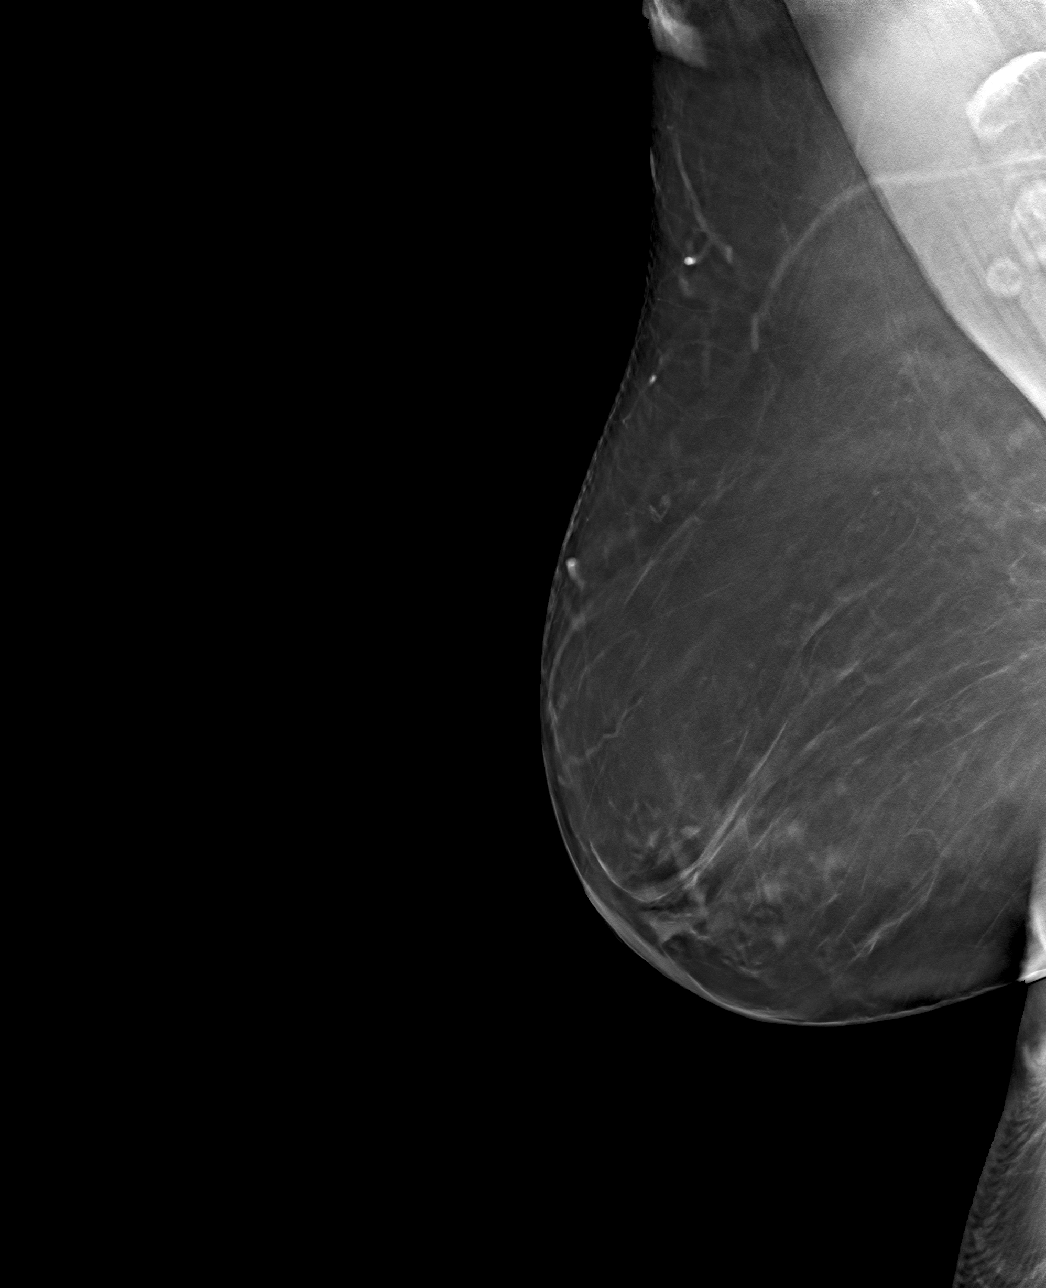

[R CC tomo · tomo slice 33/65.0]
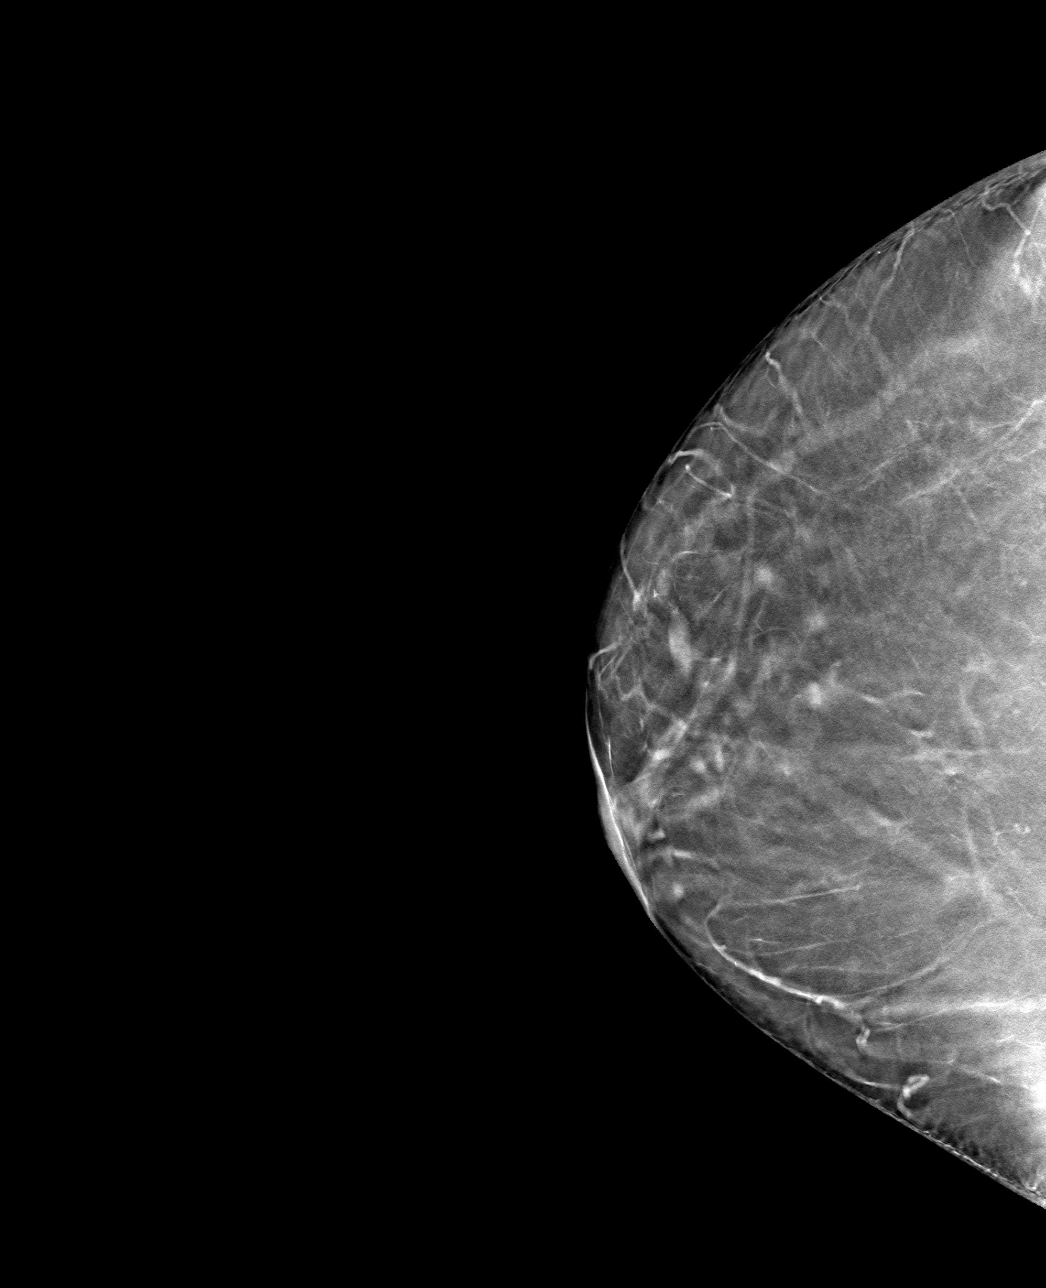

[4 of 12 positions shown; findings below may reference images not displayed]

FINDINGS: On the diagnostic tomosynthesis images, possible asymmetry is less
prominent, appearing as area probable prominent ducts in the lateral
right breast. There is no defined mass. There are no areas of
architectural distortion and there are no suspicious calcifications.

Mammographic images were processed with CAD.

Targeted ultrasound is performed, showing mildly dilated ducts in
the right breast laterally, but no mass or suspicious lesion. The
ducts correspond to the original mammographic asymmetry.
IMPRESSION: 1. No evidence of breast malignancy.

RECOMMENDATION:
Screening mammogram in one year.(Code:WN-[id number])

I have discussed the findings and recommendations with the patient.
If applicable, a reminder letter will be sent to the patient
regarding the next appointment.

BI-RADS CATEGORY  1: Negative.

## 2021-04-13 ENCOUNTER — Ambulatory Visit: Payer: BC Managed Care – PPO | Admitting: Internal Medicine

## 2021-04-19 ENCOUNTER — Encounter: Payer: Self-pay | Admitting: Internal Medicine

## 2021-04-19 ENCOUNTER — Ambulatory Visit: Payer: BC Managed Care – PPO | Admitting: Internal Medicine

## 2021-04-19 VITALS — BP 128/82 | HR 86 | Ht 63.0 in | Wt 239.7 lb

## 2021-04-19 DIAGNOSIS — E042 Nontoxic multinodular goiter: Secondary | ICD-10-CM

## 2021-04-19 DIAGNOSIS — E1165 Type 2 diabetes mellitus with hyperglycemia: Secondary | ICD-10-CM | POA: Diagnosis not present

## 2021-04-19 DIAGNOSIS — E782 Mixed hyperlipidemia: Secondary | ICD-10-CM

## 2021-04-19 LAB — MICROALBUMIN / CREATININE URINE RATIO
Creatinine,U: 61.2 mg/dL
Microalb Creat Ratio: 1.1 mg/g (ref 0.0–30.0)
Microalb, Ur: <0.7 mg/dL (ref 0.0–1.9)

## 2021-04-19 LAB — COMPREHENSIVE METABOLIC PANEL
ALT: 39 U/L — ABNORMAL HIGH (ref 0–35)
AST: 25 U/L (ref 0–37)
Albumin: 4.4 g/dL (ref 3.5–5.2)
Alkaline Phosphatase: 70 U/L (ref 39–117)
BUN: 15 mg/dL (ref 6–23)
CO2: 29 mEq/L (ref 19–32)
Calcium: 9.8 mg/dL (ref 8.4–10.5)
Chloride: 103 mEq/L (ref 96–112)
Creatinine, Ser: 0.78 mg/dL (ref 0.40–1.20)
GFR: 82.32 mL/min (ref >60.00)
Glucose, Bld: 100 mg/dL — ABNORMAL HIGH (ref 70–99)
Potassium: 4.5 mEq/L (ref 3.5–5.1)
Sodium: 139 mEq/L (ref 135–145)
Total Bilirubin: 0.6 mg/dL (ref 0.2–1.2)
Total Protein: 7.1 g/dL (ref 6.0–8.3)

## 2021-04-19 LAB — LIPID PANEL
Cholesterol: 231 mg/dL — ABNORMAL HIGH (ref 0–200)
HDL: 58.7 mg/dL (ref >39.00)
LDL Cholesterol: 144 mg/dL — ABNORMAL HIGH (ref 0–99)
NonHDL: 172.54
Total CHOL/HDL Ratio: 4
Triglycerides: 145 mg/dL (ref 0.0–149.0)
VLDL: 29 mg/dL (ref 0.0–40.0)

## 2021-04-19 LAB — POCT GLYCOSYLATED HEMOGLOBIN (HGB A1C): Hemoglobin A1C: 6 % — AB (ref 4.0–5.6)

## 2021-04-19 MED ORDER — METFORMIN HCL ER 500 MG PO TB24: ORAL_TABLET | ORAL | 3 refills | Status: DC

## 2021-04-19 NOTE — Patient Instructions (Addendum)
Please continue: ?- Metformin ER 1000 mg 2x a day with meals. ? ?Continue to check sugars once a day. ? ?Please stop at the lab. ? ?Please return in 6 months with your sugar log.  ?

## 2021-04-19 NOTE — Progress Notes (Signed)
Patient ID: Jade Anderson, female   DOB: 01-Dec-1960, 61 y.o.   MRN: [id number]  ? ?This visit occurred during the SARS-CoV-2 public health emergency.  Safety protocols were in place, including screening questions prior to the visit, additional usage of staff PPE, and extensive cleaning of exam room while observing appropriate contact time as indicated for disinfecting solutions.   ? ?HPI: ?Jade Anderson is a 61 y.o.-year-old female, presenting for follow-up for DM2, dx in 2010 non-insulin-dependent, without long term complications.  Last visit 6 months ago. ? ?Interim history: ?No increased urination, blurry vision, nausea, chest pain. ?She has Morton Neuroma - changed shoes - feeling better. ? ?Reviewed HbA1c levels: ?Lab Results  ?Component Value Date  ? HGBA1C 5.8 (A) 10/12/2020  ? HGBA1C 5.9 (A) 04/02/2020  ? HGBA1C 5.8 (A) 07/11/2019  ? HGBA1C 5.7 (A) 01/10/2019  ? HGBA1C 6.0 (A) 07/10/2018  ? HGBA1C 5.6 01/08/2018  ? HGBA1C 5.7 (A) 09/08/2017  ? HGBA1C 6.6 (H) 03/09/2017  ? HGBA1C 6.2 (A) 02/23/2016  ? HGBA1C 6.0 (H) 12/25/2014  ?2010: HbA1c 9.4% ? ?She is on: ?- Metformin ER 500 g 2x a day >> 1000 mg 2x a day -started 05/2018-tolerates this well, initially had diarrhea with this ?- Berberine ?- Chromium ? ?Pt checks her sugars 0-1x a day: ?- am:  106-137, 156 >> 113-142, 150 >> 111-145 >> 122-130 >> 113-134 ?- 2h after b'fast: n/c >> 162 >> n/c >> 130 >> n/c >> 120-150, 169 ?- before lunch: 91, 92 >> 95 >> 110-118 >> 94-116 >> 98, 101  >> 92-129 ?- 2h after lunch: 116, 205 >> 93, 180 >> 117-148 >>  82,140, 146 >> 94-145 ?- before dinner:  108-117 >> 90-112 >> 97 >> 105, 110 >> n/c >> 95-119 ?- 2h after dinner:  101-148 >> 142-160 >> 145 >> 138-164, 191 >> 148, 170 ?- bedtime:   100-131, 156 >> n/c >> 110-138, 153 >> 93-136 >> 100-126 ?- nighttime: n/c ?Lowest sugar was 90 >> 93 >> 82 >> 92; she has hypoglycemia awareness in the 70s. ?Highest sugar was 205 (dessert) >> 182 >> 191 >> 170. ? ?She  has a history of pancreatitis as a child, due to mumps. ? ?Glucometer: ReliOn ? ?Pt's meals are: ?- Breakfast: Oatmeal, sausage, bacon, biscuits, hash brown, fruit, peanut butter  (allergic to eggs) ?- Lunch: Lunch meat, hamburger, hot dog, salads ?- Dinner: Organic beef or chicken, vegetables, salads, bread ?- Snacks: 3: Fruit, nuts ? ?-No CKD, last BUN/creatinine:  ?Lab Results  ?Component Value Date  ? BUN 13 04/10/2020  ? BUN 15 01/10/2019  ? CREATININE 0.80 04/10/2020  ? CREATININE 0.85 01/10/2019  ? ?-+ HL; last set of lipids: ?Lab Results  ?Component Value Date  ? CHOL 203 (H) 04/10/2020  ? HDL 48.80 04/10/2020  ? LDLCALC 134 (H) 04/10/2020  ? TRIG 101.0 04/10/2020  ? CHOLHDL 4 04/10/2020  ?On omega-3 fatty acids.  She is intolerant to statins: Muscle aches with Lipitor.  She refused to try statins again.  I suggested Zetia. She tried it and believes that she had diarrhea (cannot remember exactly).  She refused to retry this, also, she declined ?12.. ? ?- last eye exam was in 03/2019: No DR, reportedly.  On berberine. Dr. Edrick Oh retired [facility]). ? ?- no numbness and tingling in her feet. ? ?She had hair loss >> TFTs normal but at ULN >> started LT3 >> worsening diabetes  >> stopped after 1 year. ? ?Latest  TSH was normal: ?Lab Results  ?Component Value Date  ? TSH 2.19 04/10/2020  ? ?She has a history of thyroid nodules: 0.6 x 0.9 cm bilaterally, per thyroid U/S from 03/31/2015.  She had a repeat U/S on 10/08/2015 ([place]). ? ?She started 2 drops of Iodine 1-2x a week. ?Takes B complex vitamins. ? ?Pt denies: ?- feeling nodules in neck ?- hoarseness ?- choking ?- SOB with lying down ?+ Intermittent dysphagia after her C-spine surgery ? ?No family history of thyroid cancer.  Her uncle had a goiter.  Also 2 cousins with Hashimoto's ds.  No history of radiation treatment to head or neck.  She is not on biotin or herbal supplements. ? ?ROS: ?+ see HPI ?+ Hand tremor after  rotator cuff surgery - L shoulder. ? ?I reviewed pt's medications, allergies, PMH, social hx, family hx, and changes were documented in the history of present illness. Otherwise, unchanged from my initial visit note. ? ?Past Medical History:  ?Diagnosis Date  ? Diabetes ([place])   ? no medication  ? Fatty liver   ? High cholesterol   ? no medication  ? History of hiatal hernia   ? History of kidney stones   ? Hypothyroidism 2017  ? followed by Dr. Ignacia Felling in [place]  ? Migraine   ? silent  ? Nerve pain left elbow following IV  ? Nerve pain right thigh  ? PONV (postoperative nausea and vomiting)   ? Ruptured disc, cervical 07/2013  ? C7-T1  ? Shingles 2018  ? Shoulder pain, left   ? Sleep apnea   ? ?Past Surgical History:  ?Procedure Laterality Date  ? BREAST REDUCTION SURGERY Bilateral 1997  ? CARPAL TUNNEL RELEASE    ? CERVICAL FUSION    ? [id number] and C6-7 2005  ? COLONOSCOPY    ? HEMORRHOID SURGERY  11/2014  ? thrombosed hemorroid opened in office  ? REDUCTION MAMMAPLASTY    ? shouder surgery Left 10/26/2017  ? rotator cuff  ? VAGINAL HYSTERECTOMY  2000  ? VULVECTOMY Left 03/14/2016  ? Procedure: LABIAL MASS EXCISION;  Surgeon: Megan Salon, MD;  Location: [facility];  Service: Gynecology;  Laterality: Left;  follow first case  ? ?Social History  ? ?Socioeconomic History  ? Marital status: Married  ?  Spouse name: Francee Piccolo  ? Number of children: 1  ? Years of education: 39  ? 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 Smoking status: Never Smoker  ? Smokeless tobacco: Never Used  ?Substance and Sexual Activity  ? Alcohol use: No  ?  Alcohol/week: 0.0 oz  ? Drug use: No  ? Sexual activity: Yes  ?  Partners: Male  ?  Birth control/protection: Surgical  ?  Comment: TAH  ?Social History Narrative  ? Patient lives at home with her husband Francee Piccolo. Patient has one child and a high school education.   ? Patient is right-handed.  Patient drinks one cup of tea daily and occasionally a  diet soda.  ? ?Current Outpatient Medications on File Prior to Visit  ?Medication Sig Dispense Refill  ? Amylase-Lipase-Protease (PANCREASE PO) Take 3 tablets by mouth 3 (three) times daily with meals.    ? Ascorbic Acid (VITAMIN C) 1000 MG tablet 1,000 mg daily.    ? Barberry-Oreg Grape-Goldenseal (BERBERINE COMPLEX PO) Take by mouth daily.    ? Cholecalciferol (VITAMIN D3) 5000 units CAPS Take 5,000 Units by mouth  every evening.    ? ibuprofen (ADVIL,MOTRIN) 200 MG tablet Take 800 mg by mouth 3 (three) times daily as needed for headache or moderate pain.    ? INOSITOL PO Take by mouth.    ? Magnesium 400 MG TABS Take 400 mg by mouth.     ? metFORMIN (GLUCOPHAGE-XR) 500 MG 24 hr tablet TAKE 2 TABLETS(1000 MG) BY MOUTH TWICE DAILY AFTER A MEAL 360 tablet 3  ? TURMERIC PO Take by mouth daily.    ? UNABLE TO FIND Med Name:  CBD hemp 60ng oil, phytocannbinoids '30mg'$  as needed    ? vitamin B-12 (CYANOCOBALAMIN) 1000 MCG tablet Take by mouth daily.    ? ?No current facility-administered medications on file prior to visit.  ? ?Allergies  ?Allergen Reactions  ? Eggs Or Egg-Derived Products   ?  Upset GI  ? Hydrocodone Nausea And Vomiting  ? Metoclopramide   ?  Anxiety  ? Other Nausea And Vomiting and Diarrhea  ?  ENTEX, Ragweed  ? Macrobid [Nitrofurantoin Monohyd Macro] Rash  ? ?Family History  ?Problem Relation Age of Onset  ? Heart Problems Mother   ? High blood pressure Mother   ? Diabetes Mother   ? Dementia Mother   ? Gout Father   ? Diabetes Father   ? Colon cancer Father   ? ?Pt has FH of DM in [place], [place], brothers. ? ?PE: ?LMP 01/03/1998  ?Wt Readings from Last 3 Encounters:  ?10/12/20 235 lb 3.2 oz (106.7 kg)  ?09/30/20 237 lb 3.2 oz (107.6 kg)  ?04/02/20 240 lb 3.2 oz (109 kg)  ? ?Constitutional: overweight, in NAD ?Eyes: PERRLA, EOMI, no exophthalmos ?ENT: moist mucous membranes, no thyromegaly, no cervical lymphadenopathy ?Cardiovascular: RRR, No MRG ?Respiratory: CTA B ?Musculoskeletal: no deformities, strength  intact in all 4 ?Skin: moist, warm, no rashes ?Neurological: +L>R  tremor with outstretched hands, DTR normal in all 4 ?Diabetic Foot Exam - Simple   ?Simple Foot Form ?Diabetic Foot exam was performed with the

## 2021-04-21 ENCOUNTER — Telehealth: Payer: Self-pay

## 2021-04-21 NOTE — Telephone Encounter (Addendum)
Lvm for pt to call back. ?----- Message from Philemon Kingdom, MD sent at 04/20/2021 10:26 AM EDT ----- ?Can you please call pt.:  ?Labs are at goal, with the exception of a high ALT (liver enzyme) (but decreased from before), total cholesterol, and also LDL cholesterol (this is higher than before, at 144, previously 134).  I would suggest working on the diet by reducing dietary cholesterol, increasing fiber, legumes, and whole grains.  Please check with her whether she wants to try again a statin or at least ezetimibe. ?

## 2021-11-02 ENCOUNTER — Encounter: Payer: Self-pay | Admitting: Internal Medicine

## 2021-11-02 ENCOUNTER — Ambulatory Visit: Payer: BC Managed Care – PPO | Admitting: Internal Medicine

## 2021-11-02 VITALS — BP 130/76 | HR 97 | Ht 63.0 in | Wt 245.6 lb

## 2021-11-02 DIAGNOSIS — E782 Mixed hyperlipidemia: Secondary | ICD-10-CM | POA: Diagnosis not present

## 2021-11-02 DIAGNOSIS — E042 Nontoxic multinodular goiter: Secondary | ICD-10-CM

## 2021-11-02 DIAGNOSIS — E1165 Type 2 diabetes mellitus with hyperglycemia: Secondary | ICD-10-CM | POA: Diagnosis not present

## 2021-11-02 LAB — POCT GLYCOSYLATED HEMOGLOBIN (HGB A1C): Hemoglobin A1C: 5.8 % — AB (ref 4.0–5.6)

## 2021-11-02 NOTE — Progress Notes (Signed)
Patient ID: Jade Anderson, female   DOB: 1960-09-03, 61 y.o.   MRN: [id number]   HPI: Jade Anderson is a 61 y.o.-year-old female, presenting for follow-up for DM2, dx in 2010 non-insulin-dependent, without long term complications.  Last visit 6 months ago.  Interim history: No increased urination, blurry vision, nausea, chest pain. She fell twice since last OV >> hit R leg >> still swollen, but improving.  Reviewed HbA1c levels: Lab Results  Component Value Date   HGBA1C 6.0 (A) 04/19/2021   HGBA1C 5.8 (A) 10/12/2020   HGBA1C 5.9 (A) 04/02/2020   HGBA1C 5.8 (A) 07/11/2019   HGBA1C 5.7 (A) 01/10/2019   HGBA1C 6.0 (A) 07/10/2018   HGBA1C 5.6 01/08/2018   HGBA1C 5.7 (A) 09/08/2017   HGBA1C 6.6 (H) 03/09/2017   HGBA1C 6.2 (A) 02/23/2016  2010: HbA1c 9.4%  She is on: - Metformin ER 500 g 2x a day >> 1000 mg 2x a day -started 61/2020-tolerates this well, initially had diarrhea with this - Berberine daily - Chromium 2-3x week  Pt checks her sugars 0-1x a day: - am: 111-145 >> 122-130 >> 113-134 >> 114-130, 147 - 2h after b'fast: n/c >> 130 >> n/c >> 120-150, 169 >> n/c - before lunch: 94-116 >> 98, 101  >> 92-129 >> 81-107 - 2h after lunch: 117-148 >>  82,140, 146 >> 94-145 >> 150 - before dinner: 97 >> 105, 110 >> n/c >> 95-119 >> 104-130 - 2h after dinner: 145 >> 138-164, 191 >> 148, 170 >> 133-168 - bedtime:  n/c >> 110-138, 153 >> 93-136 >> 100-126 >> 130 - nighttime: n/c Lowest sugar was 82 >> 92 >> 70s (exertion); she has hypoglycemia awareness in the 70s. Highest sugar was 191 >> 170 >> 182.  She has a history of pancreatitis as a child, due to mumps.  Glucometer: ReliOn  Pt's meals are: - Breakfast: Oatmeal, sausage, bacon, biscuits, hash brown, fruit, peanut butter  (allergic to eggs) - Lunch: Lunch meat, hamburger, hot dog, salads - Dinner: Organic beef or chicken, vegetables, salads, bread - Snacks: 3: Fruit, nuts  -No CKD, last BUN/creatinine:   Lab Results  Component Value Date   BUN 15 04/19/2021   BUN 13 04/10/2020   CREATININE 0.78 04/19/2021   CREATININE 0.80 04/10/2020   -+ HL; last set of lipids: Lab Results  Component Value Date   CHOL 231 (H) 04/19/2021   HDL 58.70 04/19/2021   LDLCALC 144 (H) 04/19/2021   TRIG 145.0 04/19/2021   CHOLHDL 4 04/19/2021  On omega-3 fatty acids.  She is intolerant to statins: Muscle aches with Lipitor.  She refused to try statins again.  I suggested Zetia. She tried it and believes that she had diarrhea (cannot remember exactly).  She refused to retry this.  - last eye exam was in 03/2019: No DR, reportedly.  On berberine. Dr. Edrick Oh retired [facility]).  - no numbness and tingling in her feet.  Foot exam was performed 04/2021. She has Morton Neuroma - changed shoes - feeling better.  She had hair loss >> TFTs normal but at ULN >> started LT3 >> worsening diabetes  >> stopped after 1 year.  Latest TSH was normal: Lab Results  Component Value Date   TSH 2.19 04/10/2020   She has a history of thyroid nodules: 0.6 x 0.9 cm bilaterally, per thyroid U/S from 03/31/2015.  She had a repeat U/S on 10/08/2015 ([place]).  She started 2 drops of Iodine  1-2x a week. Takes B complex vitamins.  Pt denies: - feeling nodules in neck - hoarseness - choking + Intermittent dysphagia after her C-spine surgery  No family history of thyroid cancer.  Her uncle had a goiter.  Also 2 cousins with Hashimoto's ds.  No history of radiation treatment to head or neck.    ROS: + see HPI + Hand tremor after rotator cuff surgery - L shoulder.  I reviewed pt's medications, allergies, PMH, social hx, family hx, and changes were documented in the history of present illness. Otherwise, unchanged from my initial visit note.  Past Medical History:  Diagnosis Date   Diabetes ([place])    no medication   Fatty liver    High cholesterol    no medication   History of hiatal  hernia    History of kidney stones    Hypothyroidism 2017   followed by Dr. 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   Shingles 2018   Shoulder pain, left    Sleep apnea    Past Surgical History:  Procedure Laterality Date   BREAST REDUCTION SURGERY Bilateral 1997   CARPAL TUNNEL RELEASE     CERVICAL FUSION     [phone] and C6-7 2005   COLONOSCOPY     HEMORRHOID SURGERY  11/2014   thrombosed hemorroid opened in office   REDUCTION MAMMAPLASTY     shouder surgery Left 10/26/2017   rotator cuff   VAGINAL HYSTERECTOMY  2000   VULVECTOMY Left 03/14/2016   Procedure: LABIAL MASS EXCISION;  Surgeon: Megan Salon, MD;  Location: [facility];  Service: Gynecology;  Laterality: Left;  follow first case   Social History   Socioeconomic History   Marital status: Married    Spouse name: Francee Piccolo   Number of children: 1   Years of education: 12   Highest education level: Not on file  Occupational History   housewife  Tobacco Use   Smoking status: Never Smoker   Smokeless tobacco: Never Used  Substance and Sexual Activity   Alcohol use: No    Alcohol/week: 0.0 oz   Drug use: No   Sexual activity: Yes    Partners: Male    Birth control/protection: Surgical    Comment: TAH  Social History Narrative   Patient lives at home with her husband Francee Piccolo. Patient has one child and a high school education.    Patient is right-handed.  Patient drinks one cup of tea daily and occasionally a diet soda.   Current Outpatient Medications on File Prior to Visit  Medication Sig Dispense Refill   Amylase-Lipase-Protease (PANCREASE PO) Take 3 tablets by mouth 3 (three) times daily with meals.     Ascorbic Acid (VITAMIN C) 1000 MG tablet 1,000 mg daily.     Barberry-Oreg Grape-Goldenseal (BERBERINE COMPLEX PO) Take by mouth daily.     Cholecalciferol  (VITAMIN D3) 5000 units CAPS Take 5,000 Units by mouth every evening.     ibuprofen (ADVIL,MOTRIN) 200 MG tablet Take 800 mg by mouth 3 (three) times daily as needed for headache or moderate pain.     INOSITOL PO Take by mouth.     Magnesium 400 MG TABS Take 400 mg by mouth.      metFORMIN (GLUCOPHAGE-XR) 500 MG 24 hr tablet TAKE 2 TABLETS(1000 MG)  BY MOUTH TWICE DAILY AFTER A MEAL 360 tablet 3   TURMERIC PO Take by mouth daily.     UNABLE TO FIND Med Name:  CBD hemp 60ng oil, phytocannbinoids '30mg'$  as needed     vitamin B-12 (CYANOCOBALAMIN) 1000 MCG tablet Take by mouth daily.     No current facility-administered medications on file prior to visit.   Allergies  Allergen Reactions   Eggs Or Egg-Derived Products     Upset GI   Hydrocodone Nausea And Vomiting   Metoclopramide     Anxiety   Other Nausea And Vomiting and Diarrhea    ENTEX, Ragweed   Macrobid [Nitrofurantoin Monohyd Macro] Rash   Family History  Problem Relation Age of Onset   Heart Problems Mother    High blood pressure Mother    Diabetes Mother    Dementia Mother    Gout Father    Diabetes Father    Colon cancer Father    Pt has FH of DM in [place], [place], brothers.  PE: LMP 01/03/1998  Wt Readings from Last 3 Encounters:  04/19/21 239 lb 11.2 oz (108.7 kg)  10/12/20 235 lb 3.2 oz (106.7 kg)  09/30/20 237 lb 3.2 oz (107.6 kg)   Constitutional: overweight, in NAD Eyes: EOMI, no exophthalmos ENT: no thyromegaly, no cervical lymphadenopathy Cardiovascular: RRR, No MRG Respiratory: CTA B Musculoskeletal: no deformities Skin: + slight edematous and erythematous pretibial R area Neurological: no tremor with outstretched hands  ASSESSMENT: 1. DM2, non-insulin-dependent, controlled, without long term complications, but with hyperglycemia  2. HL  3.  Thyroid nodules  PLAN:  1. Patient with longstanding, previously diet controlled diabetes, with worsening control after she started to relax her diet, after which we  had to start metformin.  Regular metformin was not tolerated due to diarrhea but she is tolerating the extended release formulation well.  HbA1c at last visit was slightly higher, but still at goal, at 6.0%.  At that time, sugars remained at goal per review of her CBG log.  We did not change her regimen. -At today's visit, the majority of her blood sugars are at goal.  There is an occasional slightly hyperglycemic value, but no need to change her regimen for now. - I suggested to:  Patient Instructions  Please continue: - Metformin ER 1000 mg 2x a day with meals.  Continue to check sugars once a day.  Please return in 6 months with your sugar log.   - we checked her HbA1c: 5.8% (excellent) - advised to check sugars at different times of the day - 1x a day, rotating check times - advised for yearly eye exams >> she is not UTD but plans to schedule this - return to clinic in 6 months  2. HL -Reviewed her latest lipid panel from last visit: LDL above target, the rest the fractions at goal: Lab Results  Component Value Date   CHOL 231 (H) 04/19/2021   HDL 58.70 04/19/2021   LDLCALC 144 (H) 04/19/2021   TRIG 145.0 04/19/2021   CHOLHDL 4 04/19/2021  -she continues on omega-3 fatty acids, but she is intolerant to statins due to previous muscle aches and weakness.  At previous visits, we discussed about possibly trying Zetia or Nexletol, but she declined. -At last visit, I again suggested statin or at least ezetimibe, but she declined.  3.  Thyroid nodules -Per review of the latest thyroid ultrasound report from 2017, nodules were all subcentimeter and not worrisome. -No neck compression symptoms -Reviewed  her latest TSH and this was normal at last check in 04/2020 -There is no need to repeat her thyroid ultrasound unless she starts to develop neck compression symptoms  Philemon Kingdom, MD PhD [facility] Endocrinology

## 2021-11-02 NOTE — Patient Instructions (Signed)
Please continue: - Metformin ER 1000 mg 2x a day with meals.  Continue to check sugars once a day.  Please return in 6 months with your sugar log.

## 2021-11-29 ENCOUNTER — Encounter (HOSPITAL_BASED_OUTPATIENT_CLINIC_OR_DEPARTMENT_OTHER): Payer: Self-pay | Admitting: Obstetrics & Gynecology

## 2021-11-29 ENCOUNTER — Ambulatory Visit (INDEPENDENT_AMBULATORY_CARE_PROVIDER_SITE_OTHER): Payer: BC Managed Care – PPO | Admitting: Obstetrics & Gynecology

## 2021-11-29 VITALS — BP 139/70 | HR 84 | Ht 63.0 in | Wt 245.8 lb

## 2021-11-29 DIAGNOSIS — Z1231 Encounter for screening mammogram for malignant neoplasm of breast: Secondary | ICD-10-CM | POA: Diagnosis not present

## 2021-11-29 DIAGNOSIS — Z01419 Encounter for gynecological examination (general) (routine) without abnormal findings: Secondary | ICD-10-CM | POA: Diagnosis not present

## 2021-11-29 DIAGNOSIS — Z90710 Acquired absence of both cervix and uterus: Secondary | ICD-10-CM

## 2021-11-29 DIAGNOSIS — Z780 Asymptomatic menopausal state: Secondary | ICD-10-CM

## 2021-11-29 DIAGNOSIS — N83201 Unspecified ovarian cyst, right side: Secondary | ICD-10-CM

## 2021-11-29 DIAGNOSIS — N83202 Unspecified ovarian cyst, left side: Secondary | ICD-10-CM

## 2021-11-29 NOTE — Progress Notes (Signed)
61 y.o. G1P1 Married White or Caucasian female here for annual exam.  Doing well.  Denies vaginal bleeding.  Will see grandchildren over the Christmas holiday.  Did lose oldest grandchild about two years ago from fentanyl overdose.  Family does seem to be adjusting.   Has ovarian cysts on ultrasound.  Pt desires to continue monitoring this.  Did bring ultrasound report from [facility] in [place], [place].  Ultrasound showed left ovary measuring 3.1 x 2.3 x 2.8cm with 1.8cm cyst, somewhat complex.  Right ovary not seen, cyst noted measuring 3.8cm.  In prior ultrasounds, right ovarian finding has included two cysts with total measurement about 3.5cm.  Ovarian cysts with mildly complex findings have been present for at least eight years.  Discussed with pt this is not ovarian cancer and that it is ok to stop monitoring these.  However, she desires to repeat this year so order will be placed to [facility] in [place], [place].  Had blood work with Dr. Cruzita Lederer in April.   On metformin.  hbA1c ws 6.0.    Patient's last menstrual period was 01/03/1998.          Smoker:  no  Health Maintenance: Pap:  not indicated.  Neg pap 2016.  H/o TVH in 2000. History of abnormal Pap:  remote hx MMG:  12/01/20221 Further evaluation was normal Colonoscopy:  07/15/2015, follow up 5 years.  Pt is aware this is due.  Declines doing this right now.  BMD:   12/04/2019, normal Screening Labs: March, 2023   reports that she has never smoked. She has never used smokeless tobacco. She reports that she does not drink alcohol and does not use drugs.  Past Medical History:  Diagnosis Date   Diabetes ([place])    no medication   Fatty liver    High cholesterol    no medication   History of hiatal hernia    History of kidney stones    Hypothyroidism 2017   followed by Dr. Ignacia Felling in [place]   Migraine    silent   Nerve pain left elbow following IV   Nerve pain right thigh   PONV (postoperative nausea and vomiting)    Ruptured disc,  cervical 07/2013   C7-T1   Shingles 2018   Shoulder pain, left    Sleep apnea     Past Surgical History:  Procedure Laterality Date   BREAST REDUCTION SURGERY Bilateral 1997   CARPAL TUNNEL RELEASE     CERVICAL FUSION     [phone] and C6-7 2005   COLONOSCOPY     HEMORRHOID SURGERY  11/2014   thrombosed hemorroid opened in office   REDUCTION MAMMAPLASTY     shouder surgery Left 10/26/2017   rotator cuff   VAGINAL HYSTERECTOMY  2000   VULVECTOMY Left 03/14/2016   Procedure: LABIAL MASS EXCISION;  Surgeon: Megan Salon, MD;  Location: [facility];  Service: Gynecology;  Laterality: Left;  follow first case    Current Outpatient Medications  Medication Sig Dispense Refill   Amylase-Lipase-Protease (PANCREASE PO) Take 3 tablets by mouth 3 (three) times daily with meals.     Ascorbic Acid (VITAMIN C) 1000 MG tablet 1,000 mg daily.     Barberry-Oreg Grape-Goldenseal (BERBERINE COMPLEX PO) Take by mouth daily.     Cholecalciferol (VITAMIN D3) 5000 units CAPS Take 5,000 Units by mouth every evening.     ibuprofen (ADVIL,MOTRIN) 200 MG tablet Take 800 mg by mouth 3 (three) times daily as needed for headache or moderate pain.  INOSITOL PO Take by mouth.     Magnesium 400 MG TABS Take 400 mg by mouth.      metFORMIN (GLUCOPHAGE-XR) 500 MG 24 hr tablet TAKE 2 TABLETS(1000 MG) BY MOUTH TWICE DAILY AFTER A MEAL 360 tablet 3   TURMERIC PO Take by mouth daily.     UNABLE TO FIND Med Name:  CBD hemp 60ng oil, phytocannbinoids '30mg'$  as needed     vitamin B-12 (CYANOCOBALAMIN) 1000 MCG tablet Take by mouth daily.     No current facility-administered medications for this visit.    Family History  Problem Relation Age of Onset   Heart Problems Mother    High blood pressure Mother    Diabetes Mother    Dementia Mother    Gout Father    Diabetes Father    Colon cancer Father     ROS: Constitutional: negative Genitourinary:negative  Exam:   BP 139/70 (BP Location: Right Arm, Patient  Position: Sitting, Cuff Size: Large)   Pulse 84   Ht '5\' 3"'$  (1.6 m) Comment: Reported  Wt 245 lb 12.8 oz (111.5 kg)   LMP 01/03/1998   BMI 43.54 kg/m   Height: '5\' 3"'$  (160 cm) (Reported)  General appearance: alert, cooperative and appears stated age Head: Normocephalic, without obvious abnormality, atraumatic Neck: no adenopathy, supple, symmetrical, trachea midline and thyroid normal to inspection and palpation Lungs: clear to auscultation bilaterally Breasts: normal appearance, no masses or tenderness Heart: regular rate and rhythm Abdomen: soft, non-tender; bowel sounds normal; no masses,  no organomegaly Extremities: extremities normal, atraumatic, no cyanosis or edema Skin: Skin color, texture, turgor normal. No rashes or lesions Lymph nodes: Cervical, supraclavicular, and axillary nodes normal. No abnormal inguinal nodes palpated Neurologic: Grossly normal   Pelvic: External genitalia:  no lesions              Urethra:  normal appearing urethra with no masses, tenderness or lesions              Bartholins and Skenes: normal                 Vagina: normal appearing vagina with normal color and no discharge, no lesions              Cervix: absent              Pap taken: No. Bimanual Exam:  Uterus:  uterus absent              Adnexa: no mass, fullness, tenderness               Rectovaginal: Confirms               Anus:  normal sphincter tone, no lesions  Chaperone, Octaviano Batty, CMA, was present for exam.  Assessment/Plan: 1. Well woman exam with routine gynecological exam - Pap smear not indicated - Mammogram 11/2019. Pt aware overdue.  Will help with scheduling. - Colonoscopy 2017, follow up 5 years.  Pt aware overdue as well.  States she will call when ready to proceed - Bone mineral density 2021 - lab work done with Dr. Cruzita Lederer earlier this year.  She does not have a PCP. - vaccines declined.  2. Encounter for screening mammogram for malignant neoplasm of breast - MM  3D SCREEN BREAST BILATERAL; Future  3. Bilateral ovarian cysts - will schedule with local hospital in [place], [place] - US PELVIC COMPLETE WITH TRANSVAGINAL; Future  4. Postmenopausal - no HRT  5. History of  total vaginal hysterectomy (TVH)

## 2022-01-13 ENCOUNTER — Other Ambulatory Visit (HOSPITAL_BASED_OUTPATIENT_CLINIC_OR_DEPARTMENT_OTHER): Payer: Self-pay | Admitting: *Deleted

## 2022-01-13 DIAGNOSIS — Z1231 Encounter for screening mammogram for malignant neoplasm of breast: Secondary | ICD-10-CM

## 2022-01-14 ENCOUNTER — Encounter (HOSPITAL_BASED_OUTPATIENT_CLINIC_OR_DEPARTMENT_OTHER): Payer: Self-pay | Admitting: *Deleted

## 2022-04-26 ENCOUNTER — Encounter: Payer: Self-pay | Admitting: Internal Medicine

## 2022-04-26 ENCOUNTER — Ambulatory Visit: Payer: BC Managed Care – PPO | Admitting: Internal Medicine

## 2022-04-26 VITALS — BP 120/86 | HR 85 | Ht 63.0 in | Wt 244.4 lb

## 2022-04-26 DIAGNOSIS — E042 Nontoxic multinodular goiter: Secondary | ICD-10-CM | POA: Diagnosis not present

## 2022-04-26 DIAGNOSIS — Z7984 Long term (current) use of oral hypoglycemic drugs: Secondary | ICD-10-CM

## 2022-04-26 DIAGNOSIS — E782 Mixed hyperlipidemia: Secondary | ICD-10-CM | POA: Diagnosis not present

## 2022-04-26 DIAGNOSIS — E1165 Type 2 diabetes mellitus with hyperglycemia: Secondary | ICD-10-CM

## 2022-04-26 LAB — COMPREHENSIVE METABOLIC PANEL
ALT: 49 U/L — ABNORMAL HIGH (ref 0–35)
AST: 35 U/L (ref 0–37)
Albumin: 4.4 g/dL (ref 3.5–5.2)
Alkaline Phosphatase: 69 U/L (ref 39–117)
BUN: 13 mg/dL (ref 6–23)
CO2: 27 mEq/L (ref 19–32)
Calcium: 10 mg/dL (ref 8.4–10.5)
Chloride: 103 mEq/L (ref 96–112)
Creatinine, Ser: 0.84 mg/dL (ref 0.40–1.20)
GFR: 74.78 mL/min (ref >60.00)
Glucose, Bld: 101 mg/dL — ABNORMAL HIGH (ref 70–99)
Potassium: 4.6 mEq/L (ref 3.5–5.1)
Sodium: 140 mEq/L (ref 135–145)
Total Bilirubin: 0.6 mg/dL (ref 0.2–1.2)
Total Protein: 7.1 g/dL (ref 6.0–8.3)

## 2022-04-26 LAB — POCT GLYCOSYLATED HEMOGLOBIN (HGB A1C): Hemoglobin A1C: 6.1 % — AB (ref 4.0–5.6)

## 2022-04-26 LAB — LIPID PANEL
Cholesterol: 226 mg/dL — ABNORMAL HIGH (ref 0–200)
HDL: 49.2 mg/dL (ref >39.00)
LDL Cholesterol: 140 mg/dL — ABNORMAL HIGH (ref 0–99)
NonHDL: 176.68
Total CHOL/HDL Ratio: 5
Triglycerides: 182 mg/dL — ABNORMAL HIGH (ref 0.0–149.0)
VLDL: 36.4 mg/dL (ref 0.0–40.0)

## 2022-04-26 LAB — TSH: TSH: 1.28 u[IU]/mL (ref 0.35–5.50)

## 2022-04-26 MED ORDER — METFORMIN HCL ER 500 MG PO TB24: ORAL_TABLET | ORAL | 3 refills | Status: DC

## 2022-04-26 NOTE — Progress Notes (Unsigned)
Patient ID: Elise Knobloch, female   DOB: [birth year], 62 y.o.   MRN: [id number]   HPI: Jemya Depierro is a 62 y.o.-year-old female, presenting for follow-up for DM2, dx in 2010 non-insulin-dependent, without long term complications.  Last visit 6 months ago.  Interim history: No increased urination, blurry vision, nausea, chest pain. She had an episode of increased fatigue and memory problems for 2 days 2 mo ago >> resolved.  Reviewed HbA1c levels: Lab Results  Component Value Date   HGBA1C 5.8 (A) 11/02/2021   HGBA1C 6.0 (A) 04/19/2021   HGBA1C 5.8 (A) 10/12/2020   HGBA1C 5.9 (A) 04/02/2020   HGBA1C 5.8 (A) 07/11/2019   HGBA1C 5.7 (A) 01/10/2019   HGBA1C 6.0 (A) 07/10/2018   HGBA1C 5.6 01/08/2018   HGBA1C 5.7 (A) 09/08/2017   HGBA1C 6.6 (H) 03/09/2017  2010: HbA1c 9.4%  She is on: - Metformin ER 500 g 2x a day >> 1000 mg 2x a day -started 05/2018-tolerates this well, initially had diarrhea with this - Berberine daily - Chromium - intermittently - CoQ10  Pt checks her sugars 0-1x a day: - am: 113-134 >> 114-130, 147 >> 119-141, 148, 166 - 2h after b'fast: 130 >> n/c >> 120-150, 169 >> n/c >> 139 - before lunch: 98, 101  >> 92-129 >> 81-107 >> 97, 142 - 2h after lunch: 82,140, 146 >> 94-145 >> 150 >> 149, 150 - before dinner: n/c >> 95-119 >> 104-130 >> 96-110 - 2h after dinner: 148, 170 >> 133-168 >> 145, 150 - bedtime: 93-136 >> 100-126 >> 130 >> 108-145, 165 - nighttime: n/c Lowest sugar was 82 >> 92 >> 70s (exertion) >> 96; she has hypoglycemia awareness in the 70s. Highest sugar was 191 >> 170 >> 182 >> 166.  She has a history of pancreatitis as a child, due to mumps.  Glucometer: ReliOn  Pt's meals are: - Breakfast: Oatmeal, sausage, bacon, biscuits, hash brown, fruit, peanut butter  (allergic to eggs) - Lunch: Lunch meat, hamburger, hot dog, salads - Dinner: Organic beef or chicken, vegetables, salads, bread - Snacks: 3: Fruit, nuts  -No CKD, last  BUN/creatinine:  Lab Results  Component Value Date   BUN 15 04/19/2021   BUN 13 04/10/2020   CREATININE 0.78 04/19/2021   CREATININE 0.80 04/10/2020   -+ HL; last set of lipids: Lab Results  Component Value Date   CHOL 231 (H) 04/19/2021   HDL 58.70 04/19/2021   LDLCALC 144 (H) 04/19/2021   TRIG 145.0 04/19/2021   CHOLHDL 4 04/19/2021  On omega-3 fatty acids.  She is intolerant to statins: Muscle aches with Lipitor.  She refused to try statins again.  I suggested Zetia. She tried it and believes that she had diarrhea (cannot remember exactly).  She refused to retry this.  - last eye exam was in 03/2019: No DR, reportedly.  On berberine. Dr. Marina Gravel retired [facility]).  She is trying to establish care with another provider.  - no numbness and tingling in her feet.  Foot exam was performed 04/2021. She has Morton Neuroma - changed shoes - feeling better.  She had hair loss >> TFTs normal but at ULN >> started LT3 >> worsening diabetes  >> stopped after 1 year.  Latest TSH was normal: Lab Results  Component Value Date   TSH 2.19 04/10/2020   She has a history of thyroid nodules: 0.6 x 0.9 cm bilaterally, per thyroid U/S from 03/31/2015.  She had a repeat U/S on 10/08/2015 (  [facility]).  She started 2 drops of Iodine 1-2x a week. Takes B complex vitamins.  Pt denies: - feeling nodules in neck - hoarseness - choking + Intermittent dysphagia after her C-spine surgery  No family history of thyroid cancer.  Her uncle had a goiter.  Also 2 cousins with Hashimoto's ds.  No history of radiation treatment to head or neck.    ROS: + see HPI + Hand tremor after rotator cuff surgery - L shoulder.  I reviewed pt's medications, allergies, PMH, social hx, family hx, and changes were documented in the history of present illness. Otherwise, unchanged from my initial visit note.  Past Medical History:  Diagnosis Date   Diabetes (HCC)    no medication    Fatty liver    High cholesterol    no medication   History of hiatal hernia    History of kidney stones    Hypothyroidism 2017   followed by Dr. 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   C7-T1   Shingles 2018   Shoulder pain, left    Sleep apnea    Past Surgical History:  Procedure Laterality Date   BREAST REDUCTION SURGERY Bilateral 1997   CARPAL TUNNEL RELEASE     CERVICAL FUSION     [phone] and C6-7 2005   COLONOSCOPY     HEMORRHOID SURGERY  11/2014   thrombosed hemorroid opened in office   REDUCTION MAMMAPLASTY     shouder surgery Left 10/26/2017   rotator cuff   VAGINAL HYSTERECTOMY  2000   VULVECTOMY Left 03/14/2016   Procedure: LABIAL MASS EXCISION;  Surgeon: Jerene Bears, MD;  Location: [facility];  Service: Gynecology;  Laterality: Left;  follow first case   Social History   Socioeconomic History   Marital status: Married    Spouse name: Fredrik Cove   Number of children: 1   Years of education: 12   Highest education level: Not on file  Occupational History   housewife  Tobacco Use   Smoking status: Never Smoker   Smokeless tobacco: Never Used  Substance and Sexual Activity   Alcohol use: No    Alcohol/week: 0.0 oz   Drug use: No   Sexual activity: Yes    Partners: Male    Birth control/protection: Surgical    Comment: TAH  Social History Narrative   Patient lives at home with her husband Fredrik Cove. Patient has one child and a high school education.    Patient is right-handed.  Patient drinks one cup of tea daily and occasionally a diet soda.   Current Outpatient Medications on File Prior to Visit  Medication Sig Dispense Refill   Amylase-Lipase-Protease (PANCREASE PO) Take 3 tablets by mouth 3 (three) times daily with meals.     Ascorbic Acid (VITAMIN C) 1000 MG tablet 1,000 mg daily.     Barberry-Oreg Grape-Goldenseal  (BERBERINE COMPLEX PO) Take by mouth daily.     Cholecalciferol (VITAMIN D3) 5000 units CAPS Take 5,000 Units by mouth every evening.     ibuprofen (ADVIL,MOTRIN) 200 MG tablet Take 800 mg by mouth 3 (three) times daily as needed for headache or moderate pain.     INOSITOL PO Take by mouth.     Magnesium 400 MG TABS Take 400 mg by mouth.  metFORMIN (GLUCOPHAGE-XR) 500 MG 24 hr tablet TAKE 2 TABLETS(1000 MG) BY MOUTH TWICE DAILY AFTER A MEAL 360 tablet 3   TURMERIC PO Take by mouth daily.     UNABLE TO FIND Med Name:  CBD hemp 60ng oil, phytocannbinoids  as needed     vitamin B-12 (CYANOCOBALAMIN) 1000 MCG tablet Take by mouth daily.     No current facility-administered medications on file prior to visit.   Allergies  Allergen Reactions   Egg-Derived Products     Upset GI   Hydrocodone Nausea And Vomiting   Metoclopramide     Anxiety   Other Nausea And Vomiting and Diarrhea    ENTEX, Ragweed   Macrobid [Nitrofurantoin Monohyd Macro] Rash   Family History  Problem Relation Age of Onset   Heart Problems Mother    High blood pressure Mother    Diabetes Mother    Dementia Mother    Gout Father    Diabetes Father    Colon cancer Father    Pt has FH of DM in [place], [place], brothers.  PE: BP 120/86 (BP Location: Left Arm, Patient Position: Sitting, Cuff Size: Normal)   Pulse 85   Ht  (1.6 m)   Wt 244 lb 6.4 oz (110.9 kg)   LMP 01/03/1998   SpO2 99%   BMI 43.29 kg/m  Wt Readings from Last 3 Encounters:  04/26/22 244 lb 6.4 oz (110.9 kg)  11/29/21 245 lb 12.8 oz (111.5 kg)  11/02/21 245 lb 9.6 oz (111.4 kg)   Constitutional: overweight, in NAD Eyes: EOMI, no exophthalmos ENT: no thyromegaly, no cervical lymphadenopathy Cardiovascular: RRR, No MRG Respiratory: CTA B Musculoskeletal: no deformities Skin: no rashes Neurological: + tremor with outstretched hands Diabetic Foot Exam - Simple   Simple Foot Form Diabetic Foot exam was performed with the following  findings: Yes 04/26/2022 11:04 AM  Visual Inspection No deformities, no ulcerations, no other skin breakdown bilaterally: Yes Sensation Testing Intact to touch and monofilament testing bilaterally: Yes Pulse Check Posterior Tibialis and Dorsalis pulse intact bilaterally: Yes Comments    ASSESSMENT: 1. DM2, non-insulin-dependent, controlled, without long term complications, but with hyperglycemia  2. HL  3.  Thyroid nodules  PLAN:  1. Patient with longstanding, previously diet-controlled diabetes, with worsening control after she relaxed her diet, after which we had to start.  She could not tolerate regular metformin due to diarrhea, but she is tolerating the extended release formulation well.  HbA1c at last visit was excellent, lower, at 5.8%.  We did not change her regimen. -At today's visit, sugars are mostly at goal but also with occasional higher values in the morning.  She feels that her sugars are usually better during the summer as she is more active.  We did discuss about possibly moving 3 or 4 of the metformin tablets at night to improve her morning sugars if the sugars do not improve after increasing activity.  I refilled the metformin Rx for her. - I suggested to:  Patient Instructions  Please continue: - Metformin ER 1000 mg 2x a day with meals.  Please stop at the lab.  Continue to check sugars once a day.  Please return in 6 months with your sugar log.   - we checked her HbA1c: 6.1% (slightly higher) - advised to check sugars at different times of the day - 1x a day, rotating check times - advised for yearly eye exams >> she is not UTD -advised her to schedule this - will  check annual labs today as she does not currently have a PCP - return to clinic in 6 months  2. HL -Reviewed latest lipid panel from 04/2021: LDL above target, otherwise fractions at goal: Lab Results  Component Value Date   CHOL 231 (H) 04/19/2021   HDL 58.70 04/19/2021   LDLCALC 144 (H)  04/19/2021   TRIG 145.0 04/19/2021   CHOLHDL 4 04/19/2021  -she continues on omega-3 fatty acids, but she is intolerant to statins due to previous muscle aches and weakness.  At previous visits, we discussed about possibly trying Zetia or Nexletol, but she declined. -I previously suggested statin or at least ezetimibe, but she declined - will recheck this today  3.  Thyroid nodules -Per review of the latest thyroid ultrasound report from 2017, not as were all subcentimeter and not worrisome -no neck compression symptoms -TSH was normal in 04/2020 and will repeat this today -No need to repeat a thyroid ultrasound unless she develops neck compression symptoms or masses are felt on palpation of her neck.  Carlus Pavlov, MD PhD [facility] Endocrinology

## 2022-04-26 NOTE — Patient Instructions (Signed)
Please continue: - Metformin ER 1000 mg 2x a day with meals.  Please stop at the lab.  Continue to check sugars once a day.  Please return in 6 months with your sugar log.

## 2022-04-27 LAB — MICROALBUMIN / CREATININE URINE RATIO
Creatinine,U: 165.5 mg/dL
Microalb Creat Ratio: 0.9 mg/g (ref 0.0–30.0)
Microalb, Ur: 1.6 mg/dL (ref 0.0–1.9)

## 2022-05-04 ENCOUNTER — Ambulatory Visit: Payer: BC Managed Care – PPO | Admitting: Internal Medicine

## 2022-10-26 ENCOUNTER — Encounter: Payer: Self-pay | Admitting: Internal Medicine

## 2022-10-26 ENCOUNTER — Ambulatory Visit: Payer: BC Managed Care – PPO | Admitting: Internal Medicine

## 2022-10-26 VITALS — BP 120/76 | HR 91 | Ht 63.0 in | Wt 232.8 lb

## 2022-10-26 DIAGNOSIS — E042 Nontoxic multinodular goiter: Secondary | ICD-10-CM | POA: Diagnosis not present

## 2022-10-26 DIAGNOSIS — E1165 Type 2 diabetes mellitus with hyperglycemia: Secondary | ICD-10-CM | POA: Diagnosis not present

## 2022-10-26 DIAGNOSIS — E782 Mixed hyperlipidemia: Secondary | ICD-10-CM

## 2022-10-26 DIAGNOSIS — Z7984 Long term (current) use of oral hypoglycemic drugs: Secondary | ICD-10-CM

## 2022-10-26 LAB — POCT GLYCOSYLATED HEMOGLOBIN (HGB A1C): Hemoglobin A1C: 6 % — AB (ref 4.0–5.6)

## 2022-10-26 NOTE — Addendum Note (Signed)
Addended by: Pollie Meyer on: 10/26/2022 04:10 PM   Modules accepted: Orders

## 2022-10-26 NOTE — Progress Notes (Signed)
Patient ID: Jade Anderson, female   DOB: 03-Feb-1960, 62 y.o.   MRN: [id number]   HPI: Jade Anderson is a 62 y.o.-year-old female, presenting for follow-up for DM2, dx in 2010 non-insulin-dependent, without long term complications.  Last visit 6 months ago.  Interim history: No increased urination, blurry vision, nausea, chest pain. She lost 12 lbs since last OV  - small portions.  Reviewed HbA1c levels: Lab Results  Component Value Date   HGBA1C 6.1 (A) 04/26/2022   HGBA1C 5.8 (A) 11/02/2021   HGBA1C 6.0 (A) 04/19/2021   HGBA1C 5.8 (A) 10/12/2020   HGBA1C 5.9 (A) 04/02/2020   HGBA1C 5.8 (A) 07/11/2019   HGBA1C 5.7 (A) 01/10/2019   HGBA1C 6.0 (A) 07/10/2018   HGBA1C 5.6 01/08/2018   HGBA1C 5.7 (A) 09/08/2017  2010: HbA1c 9.4%  She is on: - Metformin ER 500 g 2x a day >> 1000 mg 2x a day -started 05/2018-tolerates this well, initially had diarrhea with this - Berberine daily - Chromium - intermittently - CoQ10  Pt checks her sugars 0-1x a day: - am: 114-130, 147 >> 119-141, 148, 166 >> 119-133, 139, 160 - 2h after b'fast: n/c >> 120-150, 169 >> n/c >> 139 >> n/c - before lunch: 92-129 >> 81-107 >> 97, 142 >> 93-130 - 2h after lunch: 94-145 >> 150 >> 149, 150 >> 118-161 - before dinner: 95-119 >> 104-130 >> 96-110 >> 99, 108, 150 - 2h after dinner: 148, 170 >> 133-168 >> 145, 150 >> 108-165 - bedtime: 93-136 >> 100-126 >> 130 >> 108-145, 165 >> 110-170 - nighttime: n/c Lowest sugar was 82 >> 92 >> 70s (exertion) >> 96 >> 93; she has hypoglycemia awareness in the 70s. Highest sugar was 191 >> 170 >> 182 >> 166 >> 170.  She has a history of pancreatitis as a child, due to mumps.  Glucometer: ReliOn  Pt's meals are: - Breakfast: Oatmeal, sausage, bacon, biscuits, hash brown, fruit, peanut butter  (allergic to eggs) - Lunch: Lunch meat, hamburger, hot dog, salads - Dinner: Organic beef or chicken, vegetables, salads, bread - Snacks: 3: Fruit, nuts  -No CKD,  last BUN/creatinine:  Lab Results  Component Value Date   BUN 13 04/26/2022   BUN 15 04/19/2021   CREATININE 0.84 04/26/2022   CREATININE 0.78 04/19/2021   Lab Results  Component Value Date   MICRALBCREAT 0.9 04/26/2022   MICRALBCREAT 1.1 04/19/2021   MICRALBCREAT 1.0 04/10/2020   MICRALBCREAT 0.8 01/10/2019   MICRALBCREAT 0.8 01/08/2018   -+ HL; last set of lipids: Lab Results  Component Value Date   CHOL 226 (H) 04/26/2022   HDL 49.20 04/26/2022   LDLCALC 140 (H) 04/26/2022   TRIG 182.0 (H) 04/26/2022   CHOLHDL 5 04/26/2022  On omega-3 fatty acids.  She is intolerant to statins: Muscle aches with Lipitor.  She refused to try statins again.  I suggested Zetia. She tried it and believes that she had diarrhea (cannot remember exactly).  She refused to retry this.  At last visit I suggested red yeast rice.  - last eye exam was in 03/2019: No DR, reportedly.  On berberine. Dr. Marina Gravel retired [facility]).  She did not establish care with another provider.  - no numbness and tingling in her feet.  She has Morton Neuroma - changed shoes - feeling better.  Last foot exam 04/26/2022 here in clinic.  She had hair loss >> TFTs normal but at ULN >> started LT3 >> worsening diabetes  >>  stopped after 1 year.  Latest TSH was normal: Lab Results  Component Value Date   TSH 1.28 04/26/2022   She has a history of thyroid nodules: 0.6 x 0.9 cm bilaterally, per thyroid U/S from 03/31/2015.  She had a repeat U/S on 10/08/2015 [facility]).  She started 2 drops of Iodine 1-2x a week. Takes B complex vitamins.  Pt denies: - feeling nodules in neck - hoarseness - choking + Intermittent dysphagia after her C-spine surgery  No family history of thyroid cancer.  Her uncle had a goiter.  Also 2 cousins with Hashimoto's ds.  No history of radiation treatment to head or neck.    ROS: + see HPI  I reviewed pt's medications, allergies, PMH, social hx, family hx,  and changes were documented in the history of present illness. Otherwise, unchanged from my initial visit note.  Past Medical History:  Diagnosis Date   Diabetes (HCC)    no medication   Fatty liver    High cholesterol    no medication   History of hiatal hernia    History of kidney stones    Hypothyroidism 2017   followed by Dr. 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   C7-T1   Shingles 2018   Shoulder pain, left    Sleep apnea    Past Surgical History:  Procedure Laterality Date   BREAST REDUCTION SURGERY Bilateral 1997   CARPAL TUNNEL RELEASE     CERVICAL FUSION     [phone] and C6-7 2005   COLONOSCOPY     HEMORRHOID SURGERY  11/2014   thrombosed hemorroid opened in office   REDUCTION MAMMAPLASTY     shouder surgery Left 10/26/2017   rotator cuff   VAGINAL HYSTERECTOMY  2000   VULVECTOMY Left 03/14/2016   Procedure: LABIAL MASS EXCISION;  Surgeon: Jerene Bears, MD;  Location: [facility];  Service: Gynecology;  Laterality: Left;  follow first case   Social History   Socioeconomic History   Marital status: Married    Spouse name: Fredrik Cove   Number of children: 1   Years of education: 12   Highest education level: Not on file  Occupational History   housewife  Tobacco Use   Smoking status: Never Smoker   Smokeless tobacco: Never Used  Substance and Sexual Activity   Alcohol use: No    Alcohol/week: 0.0 oz   Drug use: No   Sexual activity: Yes    Partners: Male    Birth control/protection: Surgical    Comment: TAH  Social History Narrative   Patient lives at home with her husband Fredrik Cove. Patient has one child and a high school education.    Patient is right-handed.  Patient drinks one cup of tea daily and occasionally a diet soda.   Current Outpatient Medications on File Prior to Visit  Medication Sig Dispense Refill    Amylase-Lipase-Protease (PANCREASE PO) Take 3 tablets by mouth 3 (three) times daily with meals.     Ascorbic Acid (VITAMIN C) 1000 MG tablet 1,000 mg daily.     Barberry-Oreg Grape-Goldenseal (BERBERINE COMPLEX PO) Take by mouth daily.     Cholecalciferol (VITAMIN D3) 5000 units CAPS Take 5,000 Units by mouth every evening.     ibuprofen (ADVIL,MOTRIN) 200 MG tablet Take 800 mg by mouth  3 (three) times daily as needed for headache or moderate pain.     INOSITOL PO Take by mouth.     Magnesium 400 MG TABS Take 400 mg by mouth.      metFORMIN (GLUCOPHAGE-XR) 500 MG 24 hr tablet TAKE 2 TABLETS(1000 MG) BY MOUTH TWICE DAILY AFTER A MEAL 360 tablet 3   TURMERIC PO Take by mouth daily.     UNABLE TO FIND Med Name:  CBD hemp 60ng oil, phytocannbinoids 30mg  as needed     vitamin B-12 (CYANOCOBALAMIN) 1000 MCG tablet Take by mouth daily.     No current facility-administered medications on file prior to visit.   Allergies  Allergen Reactions   Egg-Derived Products     Upset GI   Hydrocodone Nausea And Vomiting   Metoclopramide     Anxiety   Other Nausea And Vomiting and Diarrhea    ENTEX, Ragweed   Macrobid [Nitrofurantoin Monohyd Macro] Rash   Family History  Problem Relation Age of Onset   Heart Problems Mother    High blood pressure Mother    Diabetes Mother    Dementia Mother    Gout Father    Diabetes Father    Colon cancer Father    Pt has FH of DM in [place], brothers.  PE: BP 120/76   Pulse 91   Ht 5\' 3"  (1.6 m)   Wt 232 lb 12.8 oz (105.6 kg)   LMP 01/03/1998   SpO2 99%   BMI 41.24 kg/m  Wt Readings from Last 3 Encounters:  10/26/22 232 lb 12.8 oz (105.6 kg)  04/26/22 244 lb 6.4 oz (110.9 kg)  11/29/21 245 lb 12.8 oz (111.5 kg)   Constitutional: overweight, in NAD Eyes: EOMI, no exophthalmos ENT: no thyromegaly, no cervical lymphadenopathy Cardiovascular: RRR, No MRG Respiratory: CTA B Musculoskeletal: no deformities Skin: no rashes Neurological: no tremor with  outstretched hands  ASSESSMENT: 1. DM2, non-insulin-dependent, controlled, without long term complications, but with hyperglycemia  2. HL  3.  Thyroid nodules  PLAN:  1. Patient with longstanding, previously diet-controlled diabetes, with worsening control after she relaxed her diet, after which we started metformin.  She could not tolerate a regular formulation due to diarrhea which she is doing well on the metformin extended release.  Latest HbA1c was higher, at 6.1%.  At last visit, sugars are mostly at goal with occasional higher values in the morning.  We discussed about trying to increase activity but if the sugars did not improve, to possibly try taking 3 to 4 tablets of metformin at night if she could tolerate it. -At today's visit, sugars are mostly at goal with mild only occasional hyperglycemic excursions.  She did not change the metformin dose, but for now I do not feel we need to do so. - I suggested to:  Patient Instructions  Please continue: - Metformin ER 1000 mg 2x a day with meals.  Continue to check sugars once a day.  Please return in 6 months with your sugar log.   - we checked her HbA1c: 6.0% -slightly lower - advised to check sugars at different times of the day - 1x a day, rotating check times - advised for yearly eye exams >> she has an appointment coming up - I advised her to establish care with a PCP - return to clinic in 6 months  2. HL -Reviewed latest lipid panel from last visit: LDL high, triglycerides also above target: Lab Results  Component Value Date  CHOL 226 (H) 04/26/2022   HDL 49.20 04/26/2022   LDLCALC 140 (H) 04/26/2022   TRIG 182.0 (H) 04/26/2022   CHOLHDL 5 04/26/2022  -she continues on omega-3 fatty acids, but she is intolerant to statins due to previous muscle aches and weakness.   -She declined Bempedoic acid, ezetimibe -At last visit, I suggested Red yeast rice, but she is not on this now.  Upon questioning, she did not receive the  results of the labs checked at last visit we reviewed them today and I gave her a printed report.  Recommended RYR, which she agrees to start.  3.  Thyroid nodules -I will review the latest thyroid ultrasound report from 2017 - nodules were subcentimeter send not worrisome -she denies neck compression symptoms -TSH was normal at last check in 2022 -No need to repeat the thyroid ultrasound unless she develops neck compression symptoms or masses are felt on palpation of her neck  She prefers a letter with results from now on.  Carlus Pavlov, MD PhD [facility] Endocrinology

## 2022-10-26 NOTE — Patient Instructions (Addendum)
Please continue: - Metformin ER 1000 mg 2x a day with meals.  Continue to check sugars once a day.  For last visit's labs: Elevated ALT (one of the liver enzyme) which is chronic for her. Elevated LDL (bad) cholesterol and triglycerides.  She declined statins or ezetimibe -which she agreed to try a natural medication for cholesterol: Red yeast rice -this is over-the-counter, and the recommended dose is 600 mg twice a day. The rest of the labs are at goal.  Please return in 6 months with your sugar log.

## 2022-10-28 ENCOUNTER — Encounter: Payer: Self-pay | Admitting: Internal Medicine

## 2022-10-28 LAB — HM DIABETES EYE EXAM

## 2022-12-13 NOTE — Progress Notes (Unsigned)
62 y.o. G1P1 Married White or Caucasian female here for annual exam.  Doing well.  Family will come after the holidays after Christmas.  Does have chronic back issues and has noticed some thigh spasms with pain.    Desires to have ultrasound done again this year.  Desires to do in [place].    H/o diabetes and on metformin.  Last hba1c was 6.0.    Patient's last menstrual period was 01/03/1998.           Health Maintenance: Pap:  12/25/2014 Negative History of abnormal Pap:  remote hx MMG:  01/10/2022 Negative Colonoscopy:  07/15/2015, follow up 5 years.  Aware this is due.   BMD:   12/04/2019 Screening Labs: done 04/2022   reports that she has never smoked. She has never used smokeless tobacco. She reports that she does not drink alcohol and does not use drugs.  Past Medical History:  Diagnosis Date   Diabetes (HCC)    no medication   Fatty liver    High cholesterol    no medication   History of hiatal hernia    History of kidney stones    Hypothyroidism 2017   followed by Dr. 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   C7-T1   Shingles 2018   Shoulder pain, left    Sleep apnea     Past Surgical History:  Procedure Laterality Date   BREAST REDUCTION SURGERY Bilateral 1997   CARPAL TUNNEL RELEASE     CERVICAL FUSION     [phone] and C6-7 2005   COLONOSCOPY     HEMORRHOID SURGERY  11/2014   thrombosed hemorroid opened in office   REDUCTION MAMMAPLASTY     shouder surgery Left 10/26/2017   rotator cuff   VAGINAL HYSTERECTOMY  2000   VULVECTOMY Left 03/14/2016   Procedure: LABIAL MASS EXCISION;  Surgeon: Jerene Bears, MD;  Location: [facility];  Service: Gynecology;  Laterality: Left;  follow first case    Current Outpatient Medications  Medication Sig Dispense Refill   Amylase-Lipase-Protease (PANCREASE PO) Take 3 tablets by  mouth 3 (three) times daily with meals.     Ascorbic Acid (VITAMIN C) 1000 MG tablet 1,000 mg daily.     Barberry-Oreg Grape-Goldenseal (BERBERINE COMPLEX PO) Take by mouth daily.     Cholecalciferol (VITAMIN D3) 5000 units CAPS Take 5,000 Units by mouth every evening.     ibuprofen (ADVIL,MOTRIN) 200 MG tablet Take 800 mg by mouth 3 (three) times daily as needed for headache or moderate pain.     INOSITOL PO Take by mouth.     Magnesium 400 MG TABS Take 400 mg by mouth.      metFORMIN (GLUCOPHAGE-XR) 500 MG 24 hr tablet TAKE 2 TABLETS(1000 MG) BY MOUTH TWICE DAILY AFTER A MEAL 360 tablet 3   TURMERIC PO Take by mouth daily.     UNABLE TO FIND Med Name:  CBD hemp 60ng oil, phytocannbinoids 30mg  as needed     vitamin B-12 (CYANOCOBALAMIN) 1000 MCG tablet Take by mouth daily.     No current facility-administered medications for this visit.    Family History  Problem Relation Age of Onset   Heart Problems Mother    High blood pressure Mother    Diabetes Mother    Dementia Mother  Gout Father    Diabetes Father    Colon cancer Father     ROS: Constitutional: negative Genitourinary:negative  Exam:   BP 132/74 (BP Location: Right Arm, Patient Position: Sitting, Cuff Size: Large)   Pulse 85   Ht 5' 3.5" (1.613 m)   Wt 236 lb 3.2 oz (107.1 kg)   LMP 01/03/1998   BMI 41.18 kg/m   Height: 5' 3.5" (161.3 cm)  General appearance: alert, cooperative and appears stated age Head: Normocephalic, without obvious abnormality, atraumatic Neck: no adenopathy, supple, symmetrical, trachea midline and thyroid normal to inspection and palpation Lungs: clear to auscultation bilaterally Breasts: normal appearance, no masses or tenderness Heart: regular rate and rhythm Abdomen: soft, non-tender; bowel sounds normal; no masses,  no organomegaly Extremities: extremities normal, atraumatic, no cyanosis or edema Skin: Skin color, texture, turgor normal. No rashes or lesions Lymph nodes: Cervical,  supraclavicular, and axillary nodes normal. No abnormal inguinal nodes palpated Neurologic: Grossly normal   Pelvic: External genitalia:  no lesions              Urethra:  normal appearing urethra with no masses, tenderness or lesions              Bartholins and Skenes: normal                 Vagina: normal appearing vagina with normal color and no discharge, no lesions              Cervix: absent              Pap taken: No. Bimanual Exam:  Uterus:  uterus absent              Adnexa: no mass, fullness, tenderness               Rectovaginal: Confirms               Anus:  normal sphincter tone, no lesions  Chaperone, Ina Homes, CMA, was present for exam.  Assessment/Plan: 1. Well woman exam with routine gynecological exam (Primary) - Pap smear not indicated - Mammogram 01/10/2022 - Colonoscopy 2016, follow up 5 years due to family hx.  Pt aware this is overdue. - Bone mineral density 2021.  normal - lab work done 04/2022 - vaccines reviewed/updated  2. Bilateral ovarian cysts - US PELVIC COMPLETE WITH TRANSVAGINAL; Future  3. Chronic nonalcoholic liver disease  4. History of total vaginal hysterectomy (TVH)  5. Postmenopausal

## 2022-12-15 ENCOUNTER — Encounter (HOSPITAL_BASED_OUTPATIENT_CLINIC_OR_DEPARTMENT_OTHER): Payer: Self-pay | Admitting: Obstetrics & Gynecology

## 2022-12-15 ENCOUNTER — Ambulatory Visit (HOSPITAL_BASED_OUTPATIENT_CLINIC_OR_DEPARTMENT_OTHER): Payer: BC Managed Care – PPO | Admitting: Obstetrics & Gynecology

## 2022-12-15 VITALS — BP 132/74 | HR 85 | Ht 63.5 in | Wt 236.2 lb

## 2022-12-15 DIAGNOSIS — Z01419 Encounter for gynecological examination (general) (routine) without abnormal findings: Secondary | ICD-10-CM

## 2022-12-15 DIAGNOSIS — K769 Liver disease, unspecified: Secondary | ICD-10-CM

## 2022-12-15 DIAGNOSIS — Z90710 Acquired absence of both cervix and uterus: Secondary | ICD-10-CM

## 2022-12-15 DIAGNOSIS — Z780 Asymptomatic menopausal state: Secondary | ICD-10-CM

## 2022-12-15 DIAGNOSIS — N83201 Unspecified ovarian cyst, right side: Secondary | ICD-10-CM | POA: Diagnosis not present

## 2022-12-15 DIAGNOSIS — N83202 Unspecified ovarian cyst, left side: Secondary | ICD-10-CM

## 2023-03-13 ENCOUNTER — Encounter (HOSPITAL_BASED_OUTPATIENT_CLINIC_OR_DEPARTMENT_OTHER): Payer: Self-pay | Admitting: *Deleted

## 2023-04-25 ENCOUNTER — Encounter: Payer: Self-pay | Admitting: Internal Medicine

## 2023-04-25 ENCOUNTER — Ambulatory Visit: Payer: BC Managed Care – PPO | Admitting: Internal Medicine

## 2023-04-25 VITALS — BP 122/80 | HR 90 | Ht 63.5 in | Wt 237.6 lb

## 2023-04-25 DIAGNOSIS — E042 Nontoxic multinodular goiter: Secondary | ICD-10-CM | POA: Diagnosis not present

## 2023-04-25 DIAGNOSIS — E782 Mixed hyperlipidemia: Secondary | ICD-10-CM

## 2023-04-25 DIAGNOSIS — Z7984 Long term (current) use of oral hypoglycemic drugs: Secondary | ICD-10-CM

## 2023-04-25 DIAGNOSIS — E1165 Type 2 diabetes mellitus with hyperglycemia: Secondary | ICD-10-CM | POA: Diagnosis not present

## 2023-04-25 LAB — POCT GLYCOSYLATED HEMOGLOBIN (HGB A1C): Hemoglobin A1C: 5.9 % — AB (ref 4.0–5.6)

## 2023-04-25 NOTE — Progress Notes (Addendum)
 Patient ID: Jade Anderson, female   DOB: 12/13/1960, 63 y.o.   MRN: [id number]   HPI: Jade Anderson is a 63 y.o.-year-old female, presenting for follow-up for DM2, dx in 2010 non-insulin-dependent, without long term complications.  Last visit 6 months ago.  Interim history: No increased urination, blurry vision, nausea, chest pain. She has diarrhea - on Lipase, which helps. She lost 12 lbs before last OV  - small portions.  She gained 1 pound since then. She had a URI >> sugars were higher few weeks ago.  Reviewed HbA1c levels: Lab Results  Component Value Date   HGBA1C 6.0 (A) 10/26/2022   HGBA1C 6.1 (A) 04/26/2022   HGBA1C 5.8 (A) 11/02/2021   HGBA1C 6.0 (A) 04/19/2021   HGBA1C 5.8 (A) 10/12/2020   HGBA1C 5.9 (A) 04/02/2020   HGBA1C 5.8 (A) 07/11/2019   HGBA1C 5.7 (A) 01/10/2019   HGBA1C 6.0 (A) 07/10/2018   HGBA1C 5.6 01/08/2018  2010: HbA1c 9.4%  She is on: - Metformin  ER 500 g 2x a day >> 1000 mg 2x a day -started 05/2018-tolerates this well, initially had diarrhea with this - Berberine daily - Chromium - intermittently - CoQ10  Pt checks her sugars 0-1x a day: - am:   119-133, 139, 160 >> 103-133, 154 (sick) - 2h after b'fast: 120-150, 169 >> n/c >> 139 >> n/c - before lunch:  81-107 >> 97, 142 >> 93-130 >> 90-112 - 2h after lunch: 150 >> 149, 150 >> 118-161 >> 104 - before dinner: 96-110 >> 99, 108, 150 >> 123, 194 (sick) - 2h after dinner: 145, 150 >> 108-165 >> 143-170 - bedtime: 130 >> 108-145, 165 >> 110-170 >> 135 - nighttime: n/c Lowest sugar was 70s (exertion) >> 96 >> 93 >> 90; she has hypoglycemia awareness in the 70s. Highest sugar was 191 >> .Aaron Aas.170 >> 194.  She has a history of pancreatitis as a child, due to mumps.  Glucometer: ReliOn  Pt's meals are: - Breakfast: Oatmeal, sausage, bacon, biscuits, hash brown, fruit, peanut butter  (allergic to eggs) - Lunch: Lunch meat, hamburger, hot dog, salads - Dinner: Organic beef or chicken,  vegetables, salads, bread - Snacks: 3: Fruit, nuts  -No CKD, last BUN/creatinine:  Lab Results  Component Value Date   BUN 13 04/26/2022   BUN 15 04/19/2021   CREATININE 0.84 04/26/2022   CREATININE 0.78 04/19/2021   Lab Results  Component Value Date   MICRALBCREAT 0.9 04/26/2022   MICRALBCREAT 1.1 04/19/2021   MICRALBCREAT 1.0 04/10/2020   MICRALBCREAT 0.8 01/10/2019   MICRALBCREAT 0.8 01/08/2018   -+ HL; last set of lipids: Lab Results  Component Value Date   CHOL 226 (H) 04/26/2022   HDL 49.20 04/26/2022   LDLCALC 140 (H) 04/26/2022   TRIG 182.0 (H) 04/26/2022   CHOLHDL 5 04/26/2022  On omega-3 fatty acids.  She is intolerant to statins: Muscle aches with Lipitor.  She refused to try statins again.  I suggested Zetia. She tried it and believes that she had diarrhea (cannot remember exactly).  She refused to retry this.  At last 2 visits I suggested red yeast rice.  - last eye exam was 10/28/2022: No DR.  On berberine.  - no numbness and tingling in her feet.  She has Morton Neuroma - changed shoes - feeling better.  Last foot exam 04/26/2022 here in clinic.  She had hair loss >> TFTs normal but at ULN >> started LT3 >> worsening diabetes  >> stopped after 1  year.  Latest TSH was normal: Lab Results  Component Value Date   TSH 1.28 04/26/2022   She has a history of thyroid  nodules: 0.6 x 0.9 cm bilaterally, per thyroid  U/S from 03/31/2015.  She had a repeat U/S on 10/08/2015 [facility]).  She takes 2 drops of Iodine 1-2x a week. She is on B complex vitamins.  Pt denies: - feeling nodules in neck - hoarseness - choking + Intermittent dysphagia after her C-spine surgery  No family history of thyroid  cancer.  Her uncle had a goiter.  Also 2 cousins with Hashimoto's ds.  No history of radiation treatment to head or neck.    ROS: + see HPI  I reviewed pt's medications, allergies, PMH, social hx, family hx, and changes were documented in  the history of present illness. Otherwise, unchanged from my initial visit note.  Past Medical History:  Diagnosis Date   Diabetes (HCC)    no medication   Fatty liver    High cholesterol    no medication   History of hiatal hernia    History of kidney stones    Hypothyroidism 2017   followed by Dr. Lunda Salines in [place]   Migraine    silent   Nerve pain left elbow following IV   Nerve pain right thigh   PONV (postoperative nausea and vomiting)    Ruptured disc, cervical 07/2013   C7-T1   Shingles 2018   Shoulder pain, left    Sleep apnea    Past Surgical History:  Procedure Laterality Date   BREAST REDUCTION SURGERY Bilateral 1997   CARPAL TUNNEL RELEASE     CERVICAL FUSION     [phone] and C6-7 2005   COLONOSCOPY     HEMORRHOID SURGERY  11/2014   thrombosed hemorroid opened in office   REDUCTION MAMMAPLASTY     shouder surgery Left 10/26/2017   rotator cuff   VAGINAL HYSTERECTOMY  2000   VULVECTOMY Left 03/14/2016   Procedure: LABIAL MASS EXCISION;  Surgeon: Lillian Rein, MD;  Location: [facility];  Service: Gynecology;  Laterality: Left;  follow first case   Social History   Socioeconomic History   Marital status: Married    Spouse name: Jade Anderson   Number of children: 1   Years of education: 12   Highest education level: Not on file  Occupational History   housewife  Tobacco Use   Smoking status: Never Smoker   Smokeless tobacco: Never Used  Substance and Sexual Activity   Alcohol use: No    Alcohol/week: 0.0 oz   Drug use: No   Sexual activity: Yes    Partners: Male    Birth control/protection: Surgical    Comment: TAH  Social History Narrative   Patient lives at home with her husband Jade Anderson. Patient has one child and a high school education.    Patient is right-handed.  Patient drinks one cup of tea daily and occasionally a diet soda.   Current Outpatient Medications on File Prior to Visit  Medication Sig Dispense Refill   Amylase-Lipase-Protease  (PANCREASE PO) Take 3 tablets by mouth 3 (three) times daily with meals.     Ascorbic Acid (VITAMIN C) 1000 MG tablet 1,000 mg daily.     Barberry-Oreg Grape-Goldenseal (BERBERINE COMPLEX PO) Take by mouth daily.     Cholecalciferol (VITAMIN D3) 5000 units CAPS Take 5,000 Units by mouth every evening.     ibuprofen (ADVIL,MOTRIN) 200 MG tablet Take 800 mg by mouth 3 (  three) times daily as needed for headache or moderate pain.     INOSITOL PO Take by mouth.     Magnesium 400 MG TABS Take 400 mg by mouth.      metFORMIN  (GLUCOPHAGE -XR) 500 MG 24 hr tablet TAKE 2 TABLETS(1000 MG) BY MOUTH TWICE DAILY AFTER A MEAL 360 tablet 3   TURMERIC PO Take by mouth daily.     UNABLE TO FIND Med Name:  CBD hemp 60ng oil, phytocannbinoids 30mg  as needed     vitamin B-12 (CYANOCOBALAMIN) 1000 MCG tablet Take by mouth daily.     No current facility-administered medications on file prior to visit.   Allergies  Allergen Reactions   Egg-Derived Products     Upset GI   Hydrocodone Nausea And Vomiting   Metoclopramide     Anxiety   Other Nausea And Vomiting and Diarrhea    ENTEX, Ragweed   Macrobid [Nitrofurantoin Monohyd Macro] Rash   Family History  Problem Relation Age of Onset   Heart Problems Mother    High blood pressure Mother    Diabetes Mother    Dementia Mother    Gout Father    Diabetes Father    Colon cancer Father    Pt has FH of DM in [place], [place], brothers.  PE: BP 122/80   Pulse 90   Ht 5' 3.5" (1.613 m)   Wt 237 lb 9.6 oz (107.8 kg)   LMP 01/03/1998   SpO2 96%   BMI 41.43 kg/m  Wt Readings from Last 3 Encounters:  04/25/23 237 lb 9.6 oz (107.8 kg)  12/15/22 236 lb 3.2 oz (107.1 kg)  10/26/22 232 lb 12.8 oz (105.6 kg)   Constitutional: overweight, in NAD Eyes: EOMI, no exophthalmos ENT: no thyromegaly, no cervical lymphadenopathy Cardiovascular: RRR, No MRG Respiratory: CTA B Musculoskeletal: no deformities Skin: no rashes Neurological: no tremor with outstretched  hands Diabetic Foot Exam - Simple   Simple Foot Form Diabetic Foot exam was performed with the following findings: Yes 04/25/2023  8:13 AM  Visual Inspection No deformities, no ulcerations, no other skin breakdown bilaterally: Yes Sensation Testing Intact to touch and monofilament testing bilaterally: Yes Pulse Check Posterior Tibialis and Dorsalis pulse intact bilaterally: Yes Comments + mild B pitting edema     ASSESSMENT: 1. DM2, non-insulin-dependent, controlled, without long term complications, but with hyperglycemia  2. HL  3.  Thyroid  nodules  PLAN:  1. Patient with longstanding, previously diet controlled diabetes, with worsening control after relaxing her diet, currently on metformin .  She could not tolerate regular metformin  due to diarrhea but she is doing well on the extended release formulation.  At last visit HbA1c was lower, at 6.0%.  Sugars were mostly at goal with only mild occasional hyperglycemic excursions.  We did not change her regimen at that time. - At today's visit, most of her blood sugars are at goal with the exception of her.  Of several days when she has a URI and the sugars were higher, up to 190s, 1.5 mo ago.  No need to change the regimen for now. - I suggested to:  Patient Instructions  Please continue: - Metformin  ER 1000 mg 2x a day with meals.  Continue to check sugars once a day.  Please return in 6 months with your sugar log.   - we checked her HbA1c: 5.9% (lower) - advised to check sugars at different times of the day - 1x a day, rotating check times - advised for yearly eye  exams >> she is UTD - she did not establish care with a PCP yet - return to clinic in 6 months  2. HL - Latest lipid panel showed an elevated LDL and triglyceride level: Lab Results  Component Value Date   CHOL 226 (H) 04/26/2022   HDL 49.20 04/26/2022   LDLCALC 140 (H) 04/26/2022   TRIG 182.0 (H) 04/26/2022   CHOLHDL 5 04/26/2022  -she continues on omega-3  fatty acids, but she is intolerant to statins due to previous muscle aches and weakness.   - She declined Bempedoic acid, ezetimibe -I previously recommended red yeast rice, which she agreed to start -now taking it and tolerates it well  3.  Thyroid  nodules - Per the report of the thyroid  ultrasound from 2017- her nodules were subcentimeter and not worrisome - No neck compression symptoms - TSH was normal: Lab Results  Component Value Date   TSH 1.28 04/26/2022  - No need to repeat her thyroid  ultrasound unless she develops neck compression symptoms or masses are felt on palpation of her neck  Orders Placed This Encounter  Procedures   Microalbumin / creatinine urine ratio   Lipid Panel w/reflex Direct LDL   Comprehensive metabolic panel with GFR   POCT glycosylated hemoglobin (Hb A1C)   She prefers a letter with results.  Emilie Harden, MD PhD [facility] Endocrinology

## 2023-04-25 NOTE — Patient Instructions (Signed)
Please continue: - Metformin ER 1000 mg 2x a day with meals.  Continue to check sugars once a day.  Please return in 6 months with your sugar log.  

## 2023-04-26 LAB — MICROALBUMIN / CREATININE URINE RATIO
Creatinine, Urine: 131 mg/dL (ref 20–275)
Microalb Creat Ratio: 7 mg/g{creat} (ref <30)
Microalb, Ur: 0.9 mg/dL

## 2023-04-26 LAB — LIPID PANEL W/REFLEX DIRECT LDL
Cholesterol: 226 mg/dL — ABNORMAL HIGH (ref <200)
HDL: 52 mg/dL (ref >=50)
LDL Cholesterol (Calc): 147 mg/dL — ABNORMAL HIGH
Non-HDL Cholesterol (Calc): 174 mg/dL — ABNORMAL HIGH (ref <130)
Total CHOL/HDL Ratio: 4.3 (calc) (ref <5.0)
Triglycerides: 143 mg/dL (ref <150)

## 2023-04-26 LAB — COMPREHENSIVE METABOLIC PANEL WITH GFR
AG Ratio: 1.7 (calc) (ref 1.0–2.5)
ALT: 34 U/L — ABNORMAL HIGH (ref 6–29)
AST: 32 U/L (ref 10–35)
Albumin: 4.3 g/dL (ref 3.6–5.1)
Alkaline phosphatase (APISO): 80 U/L (ref 37–153)
BUN: 11 mg/dL (ref 7–25)
CO2: 28 mmol/L (ref 20–32)
Calcium: 9.5 mg/dL (ref 8.6–10.4)
Chloride: 104 mmol/L (ref 98–110)
Creat: 0.78 mg/dL (ref 0.50–1.05)
Globulin: 2.5 g/dL (ref 1.9–3.7)
Glucose, Bld: 121 mg/dL — ABNORMAL HIGH (ref 65–99)
Potassium: 4.1 mmol/L (ref 3.5–5.3)
Sodium: 140 mmol/L (ref 135–146)
Total Bilirubin: 0.7 mg/dL (ref 0.2–1.2)
Total Protein: 6.8 g/dL (ref 6.1–8.1)
eGFR: 86 mL/min/{1.73_m2} (ref >=60)

## 2023-06-21 ENCOUNTER — Other Ambulatory Visit: Payer: Self-pay | Admitting: Internal Medicine

## 2023-10-23 ENCOUNTER — Ambulatory Visit: Admitting: Internal Medicine

## 2023-10-23 ENCOUNTER — Encounter: Payer: Self-pay | Admitting: Internal Medicine

## 2023-10-23 VITALS — BP 128/72 | HR 84 | Ht 63.5 in | Wt 235.0 lb

## 2023-10-23 DIAGNOSIS — E042 Nontoxic multinodular goiter: Secondary | ICD-10-CM

## 2023-10-23 DIAGNOSIS — E782 Mixed hyperlipidemia: Secondary | ICD-10-CM | POA: Diagnosis not present

## 2023-10-23 DIAGNOSIS — Z7984 Long term (current) use of oral hypoglycemic drugs: Secondary | ICD-10-CM | POA: Diagnosis not present

## 2023-10-23 DIAGNOSIS — E1165 Type 2 diabetes mellitus with hyperglycemia: Secondary | ICD-10-CM

## 2023-10-23 LAB — POCT GLYCOSYLATED HEMOGLOBIN (HGB A1C): Hemoglobin A1C: 6.1 % — AB (ref 4.0–5.6)

## 2023-10-23 MED ORDER — METFORMIN HCL ER 500 MG PO TB24: ORAL_TABLET | ORAL | 3 refills | Status: DC

## 2023-10-23 MED ORDER — METFORMIN HCL ER 500 MG PO TB24: ORAL_TABLET | ORAL | 3 refills | Status: AC

## 2023-10-23 NOTE — Progress Notes (Signed)
 Patient ID: Jade Anderson, female   DOB: [birth year], 64 y.o.   MRN: [id number]   HPI: Jade Anderson is a 63 y.o.-year-old female, presenting for follow-up for DM2, dx in 2010 non-insulin-dependent, without long term complications.  Last visit 6 months ago.  Interim history: No increased urination, blurry vision, nausea, chest pain. She has a h/o diarrhea - on Lipase, which helps. 6 months ago she had to change her metformin  ER manufacturer.  She feels that her sugars have been higher since.  Reviewed HbA1c levels: Lab Results  Component Value Date   HGBA1C 5.9 (A) 04/25/2023   HGBA1C 6.0 (A) 10/26/2022   HGBA1C 6.1 (A) 04/26/2022   HGBA1C 5.8 (A) 11/02/2021   HGBA1C 6.0 (A) 04/19/2021   HGBA1C 5.8 (A) 10/12/2020   HGBA1C 5.9 (A) 04/02/2020   HGBA1C 5.8 (A) 07/11/2019   HGBA1C 5.7 (A) 01/10/2019   HGBA1C 6.0 (A) 07/10/2018  2010: HbA1c 9.4%  She is on: - Metformin  ER 500 g 2x a day >> 1000 mg 2x a day -started 05/2018-tolerates this well, initially had diarrhea with this - Berberine daily - Chromium - intermittently - CoQ10  Pt checks her sugars 0-1x a day: - am:   119-133, 139, 160 >> 103-133, 154 (sick) >> 127-169 - 2h after b'fast: 120-150, 169 >> n/c >> 139 >> n/c >> 159 - before lunch:  97, 142 >> 93-130 >> 90-112 >> 105, 110 - 2h after lunch: 150 >> 149, 150 >> 118-161 >> 104 >> n/c - before dinner: 99, 108, 150 >> 123, 194 (sick) >> 105-153 - 2h after dinner: 145, 150 >> 108-165 >> 143-170 >> n/c - bedtime: 130 >> 108-145, 165 >> 110-170 >> 135 >> 121-162 - nighttime: n/c Lowest sugar was 70s (exertion) >> ...90 >> 105; she has hypoglycemia awareness in the 70s. Highest sugar was 191 >> .Jade Anderson.170 >> 194 >> 169.  She has a history of pancreatitis as a child, due to mumps.  Glucometer: ReliOn  Pt's meals are: - Breakfast: Oatmeal, sausage, bacon, biscuits, hash brown, fruit, peanut butter  (allergic to eggs) - Lunch: Lunch meat, hamburger, hot dog,  salads - Dinner: Organic beef or chicken, vegetables, salads, bread - Snacks: 3: Fruit, nuts  -No CKD, last BUN/creatinine:  Lab Results  Component Value Date   BUN 11 04/25/2023   BUN 13 04/26/2022   CREATININE 0.78 04/25/2023   CREATININE 0.84 04/26/2022   Lab Results  Component Value Date   MICRALBCREAT 7 04/25/2023   -+ HL; last set of lipids: Lab Results  Component Value Date   CHOL 226 (H) 04/25/2023   HDL 52 04/25/2023   LDLCALC 147 (H) 04/25/2023   TRIG 143 04/25/2023   CHOLHDL 4.3 04/25/2023  On omega-3 fatty acids.  She is intolerant to statins: Muscle aches with Lipitor.  She refused to try statins again.  I suggested Zetia. She tried it and believes that she had diarrhea (cannot remember exactly).  She refused to retry this.  Currently on red yeast rice - not taking this in 04/2023, but started.  - last eye exam was 10/28/2022: No DR.  On berberine.  - no numbness and tingling in her feet.  She has Morton Neuroma - changed shoes - feeling better.  Last foot exam 04/25/2023 here in clinic.  She had hair loss >> TFTs normal but at ULN >> started LT3 >> worsening diabetes  >> stopped after 1 year.  Latest TSH was normal: Lab Results  Component Value  Date   TSH 1.28 04/26/2022   She has a history of thyroid  nodules: 0.6 x 0.9 cm bilaterally, per thyroid  U/S from 03/31/2015.  She had a repeat U/S on 10/08/2015 [facility]).  She takes 2 drops of Iodine 1-2x a week. She is on B complex vitamins.  Pt denies: - feeling nodules in neck - hoarseness - choking + Intermittent dysphagia after her C-spine surgery  No family history of thyroid  cancer.  Her uncle had a goiter.  Also 2 cousins with Hashimoto's ds.  No history of radiation treatment to head or neck.    ROS: + see HPI  I reviewed pt's medications, allergies, PMH, social hx, family hx, and changes were documented in the history of present illness. Otherwise, unchanged from my  initial visit note.  Past Medical History:  Diagnosis Date   Diabetes (HCC)    no medication   Fatty liver    High cholesterol    no medication   History of hiatal hernia    History of kidney stones    Hypothyroidism 2017   followed by Dr. 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   Shingles 2018   Shoulder pain, left    Sleep apnea    Past Surgical History:  Procedure Laterality Date   BREAST REDUCTION SURGERY Bilateral 1997   CARPAL TUNNEL RELEASE     CERVICAL FUSION     [phone] and C6-7 2005   COLONOSCOPY     HEMORRHOID SURGERY  11/2014   thrombosed hemorroid opened in office   REDUCTION MAMMAPLASTY     shouder surgery Left 10/26/2017   rotator cuff   VAGINAL HYSTERECTOMY  2000   VULVECTOMY Left 03/14/2016   Procedure: LABIAL MASS EXCISION;  Surgeon: Ronal GORMAN Pinal, MD;  Location: [facility];  Service: Gynecology;  Laterality: Left;  follow first case   Social History   Socioeconomic History   Marital status: Married    Spouse name: Jade Anderson   Number of children: 1   Years of education: 12   Highest education level: Not on file  Occupational History   housewife  Tobacco Use   Smoking status: Never Smoker   Smokeless tobacco: Never Used  Substance and Sexual Activity   Alcohol use: No    Alcohol/week: 0.0 oz   Drug use: No   Sexual activity: Yes    Partners: Male    Birth control/protection: Surgical    Comment: TAH  Social History Narrative   Patient lives at home with her husband Jade Anderson. Patient has one child and a high school education.    Patient is right-handed.  Patient drinks one cup of tea daily and occasionally a diet soda.   Current Outpatient Medications on File Prior to Visit  Medication Sig Dispense Refill   Amylase-Lipase-Protease (PANCREASE PO) Take 3 tablets by mouth 3 (three) times daily with  meals.     Ascorbic Acid (VITAMIN C) 1000 MG tablet 1,000 mg daily.     Barberry-Oreg Grape-Goldenseal (BERBERINE COMPLEX PO) Take by mouth daily.     Cholecalciferol (VITAMIN D3) 5000 units CAPS Take 5,000 Units by mouth every evening.     ibuprofen (ADVIL,MOTRIN) 200 MG tablet Take 800 mg by mouth 3 (three) times daily as needed for headache or moderate pain.  INOSITOL PO Take by mouth. (Patient not taking: Reported on 04/25/2023)     Magnesium 400 MG TABS Take 400 mg by mouth.      metFORMIN  (GLUCOPHAGE -XR) 500 MG 24 hr tablet TAKE 2 TABLETS(1000 MG) BY MOUTH TWICE DAILY AFTER A MEAL 360 tablet 3   Red Yeast Rice Extract (RED YEAST RICE PO) Take by mouth.     TURMERIC PO Take by mouth daily.     UNABLE TO FIND Med Name:  CBD hemp 60ng oil, phytocannbinoids 30mg  as needed (Patient not taking: Reported on 04/25/2023)     vitamin B-12 (CYANOCOBALAMIN) 1000 MCG tablet Take by mouth daily.     No current facility-administered medications on file prior to visit.   Allergies  Allergen Reactions   Egg Protein-Containing Drug Products     Upset GI   Hydrocodone Nausea And Vomiting   Metoclopramide     Anxiety   Other Nausea And Vomiting and Diarrhea    ENTEX, Ragweed   Macrobid [Nitrofurantoin Monohyd Macro] Rash   Family History  Problem Relation Age of Onset   Heart Problems Mother    High blood pressure Mother    Diabetes Mother    Dementia Mother    Gout Father    Diabetes Father    Colon cancer Father    Pt has FH of DM in [place], [place], brothers.  PE: BP 128/72   Pulse 84   Ht 5' 3.5 (1.613 m)   Wt 235 lb (106.6 kg)   LMP 01/03/1998   SpO2 98%   BMI 40.98 kg/m  Wt Readings from Last 3 Encounters:  10/23/23 235 lb (106.6 kg)  04/25/23 237 lb 9.6 oz (107.8 kg)  12/15/22 236 lb 3.2 oz (107.1 kg)   Constitutional: overweight, in NAD Eyes: EOMI, no exophthalmos ENT: no thyromegaly, no cervical lymphadenopathy Cardiovascular: RRR, No MRG Respiratory: CTA B Musculoskeletal:  no deformities Skin: no rashes Neurological: no tremor with outstretched hands  ASSESSMENT: 1. DM2, non-insulin-dependent, controlled, without long term complications, but with hyperglycemia  2. HL  3.  Thyroid  nodules  PLAN:  1. Patient with longstanding, previously diet-controlled diabetes, but with worsening control after relaxing her diet, currently on metformin .  At last visit, HbA1c was lower, at 5.9%.  Most of her blood sugars were at goal, except for a period of time when she had a URI and sugars were higher, up to 190s.  At last visit we continued the same regimen.  Of note, she could not tolerate metformin  IR due to diarrhea but she is doing well on the extended release formulation. - At today's visit, sugars indeed appear to be slightly higher particularly in the morning and, in the last 2 weeks, also before dinner.  Feel that this is related to changing factors for her metformin .  At today's visit, I sent a new prescription for metformin  made by the same manufacturer that worked well for her in the past, and also printed a prescription for her.  Until she is able to fill the new prescription, I advised her to take the full dose of metformin  at night, rather than splitting the dose, to improve her morning blood sugars. - I suggested to:  Patient Instructions  Please change: - Metformin  ER 2000 mg with dinner  ...   Please return in 6 months with your sugar log.   - we checked her HbA1c: 6.1% (slightly higher) - advised to check sugars at different times of the day - 1x a day,  rotating check times - advised for yearly eye exams >> she is UTD and has an appointment coming up this week - return to clinic in 6 months  2. HL - Latest lipid panel was reviewed from 04/2023: Elevated LDL, otherwise fractions at goal: Lab Results  Component Value Date   CHOL 226 (H) 04/25/2023   HDL 52 04/25/2023   LDLCALC 147 (H) 04/25/2023   TRIG 143 04/25/2023   CHOLHDL 4.3 04/25/2023  - She  declined Bempedoic acid, ezetimibe.  She is intolerant to statins due to previous muscle aches and weakness - She continues on the red yeast rice and omega-3 fatty acids.  She does mention that she was not taking the red yeast rice at the time of the above labs, but currently taking it.  She is not sure of the dose.  I recommended 1200 mg daily.  She will look at home and see if this is the dose that she has.  3.  Thyroid  nodules - Per the report of the thyroid  ultrasound from 2017- her nodules were subcentimeter and not worrisome - No neck compression symptoms - TSH was normal at last check: Lab Results  Component Value Date   TSH 1.28 04/26/2022  - No need to repeat her thyroid  ultrasound unless she develops neck compression symptoms or masses are felt on palpation of her neck  She prefers a letter with results.  Lela Fendt, MD PhD [facility]

## 2023-10-23 NOTE — Patient Instructions (Addendum)
 Please change: - Metformin  ER 2000 mg with dinner   Component     Latest Ref Rng 04/25/2023  Cholesterol     <200 mg/dL 773 (H)   HDL Cholesterol     > OR = 50 mg/dL 52   Triglycerides     <150 mg/dL 856   LDL Cholesterol (Calc)     mg/dL (calc) 852 (H)   Total CHOL/HDL Ratio     <5.0 (calc) 4.3   Non-HDL Cholesterol (Calc)     <130 mg/dL (calc) 825 (H)     Please return in 6 months with your sugar log.

## 2023-10-31 LAB — OPHTHALMOLOGY REPORT-SCANNED

## 2023-12-25 ENCOUNTER — Ambulatory Visit (HOSPITAL_BASED_OUTPATIENT_CLINIC_OR_DEPARTMENT_OTHER): Payer: Self-pay | Admitting: Obstetrics & Gynecology

## 2024-01-17 ENCOUNTER — Ambulatory Visit (HOSPITAL_BASED_OUTPATIENT_CLINIC_OR_DEPARTMENT_OTHER): Admitting: Obstetrics & Gynecology

## 2024-04-26 ENCOUNTER — Ambulatory Visit: Admitting: Internal Medicine

## 2024-05-29 ENCOUNTER — Ambulatory Visit (HOSPITAL_BASED_OUTPATIENT_CLINIC_OR_DEPARTMENT_OTHER): Admitting: Obstetrics & Gynecology
